# Patient Record
Sex: Female | Born: 1960 | Race: White | Hispanic: No | Marital: Single | Smoking: Never smoker
Health system: Southern US, Community
[De-identification: ages and names within clinical notes are randomized; demographics above are authoritative.]

---

## 1997-09-06 ENCOUNTER — Other Ambulatory Visit: Admission: RE | Admit: 1997-09-06 | Discharge: 1997-09-06 | Payer: Self-pay | Admitting: Obstetrics and Gynecology

## 1999-04-22 ENCOUNTER — Other Ambulatory Visit: Admission: RE | Admit: 1999-04-22 | Discharge: 1999-04-22 | Payer: Self-pay | Admitting: *Deleted

## 2000-03-07 ENCOUNTER — Other Ambulatory Visit: Admission: RE | Admit: 2000-03-07 | Discharge: 2000-03-07 | Payer: Self-pay | Admitting: Obstetrics & Gynecology

## 2000-08-12 ENCOUNTER — Inpatient Hospital Stay (HOSPITAL_COMMUNITY): Admission: AD | Admit: 2000-08-12 | Discharge: 2000-08-15 | Payer: Self-pay | Admitting: *Deleted

## 2000-08-16 ENCOUNTER — Encounter: Admission: RE | Admit: 2000-08-16 | Discharge: 2000-09-14 | Payer: Self-pay | Admitting: Obstetrics & Gynecology

## 2002-12-14 ENCOUNTER — Ambulatory Visit (HOSPITAL_COMMUNITY): Admission: RE | Admit: 2002-12-14 | Discharge: 2002-12-14 | Payer: Self-pay | Admitting: Obstetrics and Gynecology

## 2004-09-08 ENCOUNTER — Other Ambulatory Visit: Admission: RE | Admit: 2004-09-08 | Discharge: 2004-09-08 | Payer: Self-pay | Admitting: Family Medicine

## 2005-03-05 ENCOUNTER — Encounter: Admission: RE | Admit: 2005-03-05 | Discharge: 2005-03-05 | Payer: Self-pay | Admitting: Occupational Medicine

## 2011-04-26 ENCOUNTER — Other Ambulatory Visit: Payer: Self-pay | Admitting: Family Medicine

## 2011-04-26 ENCOUNTER — Other Ambulatory Visit (HOSPITAL_COMMUNITY)
Admission: RE | Admit: 2011-04-26 | Discharge: 2011-04-26 | Disposition: A | Discharge status: Home / Self Care | Payer: BC Managed Care – PPO | Source: Ambulatory Visit | Attending: Family Medicine | Admitting: Family Medicine

## 2011-04-26 DIAGNOSIS — Z1159 Encounter for screening for other viral diseases: Secondary | ICD-10-CM | POA: Insufficient documentation

## 2011-04-26 DIAGNOSIS — Z124 Encounter for screening for malignant neoplasm of cervix: Secondary | ICD-10-CM | POA: Insufficient documentation

## 2012-12-04 ENCOUNTER — Other Ambulatory Visit: Payer: Self-pay

## 2012-12-04 ENCOUNTER — Ambulatory Visit
Admission: RE | Admit: 2012-12-04 | Discharge: 2012-12-04 | Disposition: A | Discharge status: Home / Self Care | Payer: 59 | Source: Ambulatory Visit

## 2012-12-04 DIAGNOSIS — R0781 Pleurodynia: Secondary | ICD-10-CM

## 2012-12-14 ENCOUNTER — Other Ambulatory Visit: Payer: Self-pay | Admitting: *Deleted

## 2012-12-14 DIAGNOSIS — R079 Chest pain, unspecified: Secondary | ICD-10-CM

## 2012-12-14 DIAGNOSIS — R011 Cardiac murmur, unspecified: Secondary | ICD-10-CM

## 2012-12-18 ENCOUNTER — Telehealth (HOSPITAL_COMMUNITY): Payer: Self-pay | Admitting: Cardiovascular Disease

## 2012-12-26 ENCOUNTER — Telehealth (HOSPITAL_COMMUNITY): Payer: Self-pay | Admitting: Cardiovascular Disease

## 2012-12-26 NOTE — Telephone Encounter (Signed)
Pt called back regarding the message that was left for her to schedule the echo and stress tests that was ordered by Dr. Alanda Amass. She states that she is unsure why she needs the tests and wants to speak with the nurse.Marland KitchenPlease call [phone].

## 2012-12-26 NOTE — Telephone Encounter (Signed)
Left message for patient to call back regarding scheduling testing

## 2012-12-27 ENCOUNTER — Telehealth (HOSPITAL_COMMUNITY): Payer: Self-pay | Admitting: Cardiovascular Disease

## 2012-12-27 NOTE — Telephone Encounter (Signed)
Pt has orders in the computer to have a echo and stress test. Pt states that she was not told that she needed these tests and would like to know when this was decided and why. Please call pt and advise me on what to do.

## 2012-12-27 NOTE — Telephone Encounter (Signed)
;  eft message for pt. To call me back tomarrow so i can talk to her about her test and the reason they are being ordered

## 2013-01-03 ENCOUNTER — Other Ambulatory Visit: Payer: Self-pay | Admitting: *Deleted

## 2013-01-07 NOTE — Telephone Encounter (Signed)
PT. CALLED AND INFORMED THAT I WILL BE IN THE [place] OFFICE TOMARROW AND CAN CALL ME THERE OR SHE CAN CALL ME ON Tuesday AND I CAN TALK TO HER THEN AND ANSWER HER QUESTIONS

## 2014-03-18 ENCOUNTER — Other Ambulatory Visit (HOSPITAL_COMMUNITY)
Admission: RE | Admit: 2014-03-18 | Discharge: 2014-03-18 | Disposition: A | Discharge status: Home / Self Care | Payer: BC Managed Care – PPO | Source: Ambulatory Visit | Attending: Family Medicine | Admitting: Family Medicine

## 2014-03-18 ENCOUNTER — Other Ambulatory Visit: Payer: Self-pay | Admitting: Family Medicine

## 2014-03-18 DIAGNOSIS — Z124 Encounter for screening for malignant neoplasm of cervix: Secondary | ICD-10-CM | POA: Diagnosis present

## 2014-03-21 LAB — CYTOLOGY - PAP

## 2018-04-15 ENCOUNTER — Emergency Department (HOSPITAL_BASED_OUTPATIENT_CLINIC_OR_DEPARTMENT_OTHER): Payer: Self-pay

## 2018-04-15 ENCOUNTER — Other Ambulatory Visit: Payer: Self-pay

## 2018-04-15 ENCOUNTER — Encounter (HOSPITAL_BASED_OUTPATIENT_CLINIC_OR_DEPARTMENT_OTHER): Payer: Self-pay | Admitting: Emergency Medicine

## 2018-04-15 ENCOUNTER — Inpatient Hospital Stay (HOSPITAL_BASED_OUTPATIENT_CLINIC_OR_DEPARTMENT_OTHER)
Admission: EM | Admit: 2018-04-15 | Discharge: 2018-04-18 | DRG: 339 | Disposition: A | Discharge status: Home / Self Care | Payer: Self-pay | Attending: General Surgery | Admitting: General Surgery

## 2018-04-15 DIAGNOSIS — E871 Hypo-osmolality and hyponatremia: Secondary | ICD-10-CM

## 2018-04-15 DIAGNOSIS — K3532 Acute appendicitis with perforation and localized peritonitis, without abscess: Principal | ICD-10-CM | POA: Diagnosis present

## 2018-04-15 DIAGNOSIS — Z882 Allergy status to sulfonamides status: Secondary | ICD-10-CM

## 2018-04-15 DIAGNOSIS — R188 Other ascites: Secondary | ICD-10-CM | POA: Diagnosis present

## 2018-04-15 DIAGNOSIS — K3580 Unspecified acute appendicitis: Secondary | ICD-10-CM | POA: Diagnosis present

## 2018-04-15 LAB — COMPREHENSIVE METABOLIC PANEL
ALT: 22 U/L (ref 0–44)
AST: 22 U/L (ref 15–41)
Albumin: 4.1 g/dL (ref 3.5–5.0)
Alkaline Phosphatase: 59 U/L (ref 38–126)
Anion gap: 9 (ref 5–15)
BUN: 11 mg/dL (ref 6–20)
CO2: 22 mmol/L (ref 22–32)
Calcium: 9 mg/dL (ref 8.9–10.3)
Chloride: 97 mmol/L — ABNORMAL LOW (ref 98–111)
Creatinine, Ser: 0.57 mg/dL (ref 0.44–1.00)
GFR calc Af Amer: >60 mL/min (ref >60)
GFR calc non Af Amer: >60 mL/min (ref >60)
Glucose, Bld: 127 mg/dL — ABNORMAL HIGH (ref 70–99)
Potassium: 3.5 mmol/L (ref 3.5–5.1)
Sodium: 128 mmol/L — ABNORMAL LOW (ref 135–145)
Total Bilirubin: 0.8 mg/dL (ref 0.3–1.2)
Total Protein: 7.4 g/dL (ref 6.5–8.1)

## 2018-04-15 LAB — CBC
HCT: 41.8 % (ref 36.0–46.0)
Hemoglobin: 13.6 g/dL (ref 12.0–15.0)
MCH: 29.5 pg (ref 26.0–34.0)
MCHC: 32.5 g/dL (ref 30.0–36.0)
MCV: 90.7 fL (ref 80.0–100.0)
Platelets: 259 10*3/uL (ref 150–400)
RBC: 4.61 MIL/uL (ref 3.87–5.11)
RDW: 12.8 % (ref 11.5–15.5)
WBC: 18.7 10*3/uL — ABNORMAL HIGH (ref 4.0–10.5)
nRBC: 0 % (ref 0.0–0.2)

## 2018-04-15 LAB — LIPASE, BLOOD: Lipase: 25 U/L (ref 11–51)

## 2018-04-15 MED ORDER — MORPHINE SULFATE (PF) 2 MG/ML IV SOLN
2.0000 mg | Freq: Once | INTRAVENOUS | Status: AC
  Administered 2018-04-15: 2 mg via INTRAVENOUS
  Filled 2018-04-15: qty 1

## 2018-04-15 MED ORDER — SODIUM CHLORIDE 0.9 % IV SOLN
INTRAVENOUS | Status: DC | PRN
  Administered 2018-04-15: 500 mL via INTRAVENOUS

## 2018-04-15 MED ORDER — HYDROMORPHONE HCL 1 MG/ML IJ SOLN
1.0000 mg | Freq: Once | INTRAMUSCULAR | Status: AC
  Administered 2018-04-15: 1 mg via INTRAVENOUS
  Filled 2018-04-15: qty 1

## 2018-04-15 MED ORDER — SODIUM CHLORIDE 0.9 % IV BOLUS
1000.0000 mL | Freq: Once | INTRAVENOUS | Status: AC
  Administered 2018-04-15: 1000 mL via INTRAVENOUS

## 2018-04-15 MED ORDER — IOPAMIDOL (ISOVUE-300) INJECTION 61%
100.0000 mL | Freq: Once | INTRAVENOUS | Status: AC | PRN
  Administered 2018-04-15: 100 mL via INTRAVENOUS

## 2018-04-15 MED ORDER — METRONIDAZOLE IN NACL 5-0.79 MG/ML-% IV SOLN
500.0000 mg | Freq: Once | INTRAVENOUS | Status: AC
  Administered 2018-04-15: 500 mg via INTRAVENOUS
  Filled 2018-04-15: qty 100

## 2018-04-15 MED ORDER — CEFTRIAXONE SODIUM 2 G IJ SOLR
2.0000 g | Freq: Once | INTRAMUSCULAR | Status: AC
  Administered 2018-04-15: 2 g via INTRAVENOUS
  Filled 2018-04-15 (×2): qty 20

## 2018-04-15 NOTE — ED Provider Notes (Signed)
[facility] EMERGENCY DEPARTMENT Provider Note   CSN: [id number] Arrival date & time: 04/15/18  1603     History   Chief Complaint Chief Complaint  Patient presents with  . Constipation    HPI Holly Morton is a 57 y.o. female without significant past medical history, presenting to the emergency department with a acute onset of diffuse abdominal pain that began yesterday.  Pain is sharp and constant.  She states for several days she is been having decreased bowel movements.  Last night she had a loose bowel movement with some gas, however has not passed any gas today.  She is treated symptoms of constipation with laxatives and enemas without relief.  She also endorses some symptoms of indigestion.  Denies nausea or vomiting, fever, urinary symptoms, pelvic complaints.  No medications tried for pain.  LMP was years ago.  The history is provided by the patient.    History reviewed. No pertinent past medical history.  There are no active problems to display for this patient.   History reviewed. No pertinent surgical history.   OB History   None      Home Medications    Prior to Admission medications   Not on File    Family History History reviewed. No pertinent family history.  Social History Social History   Tobacco Use  . Smoking status: Never Smoker  . Smokeless tobacco: Never Used  Substance Use Topics  . Alcohol use: Never    Frequency: Never  . Drug use: Never     Allergies   Sulfa antibiotics   Review of Systems Review of Systems  Constitutional: Negative for fever.  Gastrointestinal: Positive for abdominal pain and constipation. Negative for nausea and vomiting.  Genitourinary: Negative for dysuria, flank pain, vaginal bleeding and vaginal discharge.  All other systems reviewed and are negative.    Physical Exam Updated Vital Signs BP 129/72 (BP Location: Right Arm)   Pulse 84   Temp 98.9 F (37.2 C) (Oral)   Resp 18   Ht 5'  6" (1.676 m)   Wt 77.1 kg   SpO2 98%   BMI 27.44 kg/m   Physical Exam  Constitutional: She appears well-developed and well-nourished. No distress.  HENT:  Head: Normocephalic and atraumatic.  Eyes: Conjunctivae are normal.  Cardiovascular: Normal rate and regular rhythm.  Pulmonary/Chest: Effort normal and breath sounds normal.  Abdominal: Soft. Normal appearance. She exhibits distension. There is generalized tenderness. There is rebound. There is no guarding.  There is diffuse tenderness to the abdomen with positive Rovsing sign.  Neurological: She is alert.  Skin: Skin is warm.  Psychiatric: She has a normal mood and affect. Her behavior is normal.  Nursing note and vitals reviewed.    ED Treatments / Results  Labs (all labs ordered are listed, but only abnormal results are displayed) Labs Reviewed  COMPREHENSIVE METABOLIC PANEL - Abnormal; Notable for the following components:      Result Value   Sodium 128 (*)    Chloride 97 (*)    Glucose, Bld 127 (*)    All other components within normal limits  CBC - Abnormal; Notable for the following components:   WBC 18.7 (*)    All other components within normal limits  LIPASE, BLOOD    EKG None  Radiology Ct Abdomen Pelvis W Contrast  Result Date: 04/15/2018 CLINICAL DATA:  Lower abdominal pain 1 day with diarrhea. EXAM: CT ABDOMEN AND PELVIS WITH CONTRAST TECHNIQUE: Multidetector CT imaging of  the abdomen and pelvis was performed using the standard protocol following bolus administration of intravenous contrast. CONTRAST:  ISOVUE-300 IOPAMIDOL (ISOVUE-300) INJECTION 61% COMPARISON:  None. FINDINGS: Lower chest: Lung bases are within normal. Hepatobiliary: Gallbladder and biliary tree are normal. There are several small liver cysts present. 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 Pancreas is otherwise unremarkable. Spleen: Normal. Adrenals/Urinary Tract: Adrenal glands are normal. Kidneys  are normal in size. 3 mm stone over the upper pole left kidney. No hydronephrosis. Few small left-sided subcentimeter renal cortical hypodensities too small to characterize but likely cysts. Ureters and bladder are normal. Stomach/Bowel: Stomach and small bowel are normal. Inflamed appendix measuring 1.1 cm in diameter with mild adjacent inflammatory change and free fluid. 3 mm appendicular with at the origin of the appendix. No evidence of periappendiceal abscess or perforation. Appendix: Location: Right lower quadrant. Diameter: 1.1 cm. Appendicolith: 3.2 mm. Mucosal hyper-enhancement: Minimal. Extraluminal gas: None. Periappendiceal collection: None. Mild colonic diverticulosis. Vascular/Lymphatic: Normal. Reproductive: Normal. Other: No evidence of abdominal wall hernia. Musculoskeletal: Minimal degenerate change of the spine. Subtle grade 1 anterolisthesis of L4 on L5. IMPRESSION: Evidence of acute appendicitis. No evidence of perforation or periappendiceal abscess. Several small liver cysts. Nonobstructing 3 mm left renal stone. Several small subcentimeter left renal cortical hypodensities too small to characterize but likely cysts. Mild colonic diverticulosis. These results were called by telephone at the time of interpretation on 04/15/2018 at 8:35 pm to Dr. [place] ROBINSON , who verbally acknowledged these results. Electronically Signed   By: Elberta Fortis M.D.   On: 04/15/2018 20:35    Procedures Procedures (including critical care time)  Medications Ordered in ED Medications  cefTRIAXone (ROCEPHIN) 2 g in sodium chloride 0.9 % 100 mL IVPB (has no administration in time range)    And  metroNIDAZOLE (FLAGYL) IVPB 500 mg (has no administration in time range)  0.9 %  sodium chloride infusion (has no administration in time range)  sodium chloride 0.9 % bolus 1,000 mL (1,000 mLs Intravenous New Bag/Given 04/15/18 1954)  morphine 2 MG/ML injection 2 mg (2 mg Intravenous Given 04/15/18 1954)    iopamidol (ISOVUE-300) 61 % injection 100 mL (100 mLs Intravenous Contrast Given 04/15/18 2002)  HYDROmorphone (DILAUDID) injection 1 mg (1 mg Intravenous Given 04/15/18 2054)     Initial Impression / Assessment and Plan / ED Course  I have reviewed the triage vital signs and the nursing notes.  Pertinent labs & imaging results that were available during my care of the patient were reviewed by me and considered in my medical decision making (see chart for details).  Clinical Course as of Apr 15 2058  Sat Apr 15, 2018  2051 Patient discussed with Dr. Maisie Fus was with general surgery.  Patient to be transferred to [facility] long for management of acute appendicitis.   [JR]    Clinical Course User Index [JR] Robinson, [place] N, PA-C    Patient presenting with generalized abdominal pain and decreased bowel movements.  On exam vital signs are stable, afebrile.  Abdomen is soft with diffuse tenderness and positive Rovsing sign.  Labs obtained in triage show significant leukocytosis of 18.7.  Hyponatremia 128.  Otherwise labs are reassuring.  CT scan ordered with concern for acute appendicitis versus bowel obstruction.  Pain treated.  IV fluids.  CT scan acute for acute uncomplicated appendicitis.  Discussed these results with patient as well as incidental findings on CT scan.  Consult placed to general surgery, spoke with  Dr. Maisie Fushomas.  [facility].  IV antibiotics initiated.  Agreeable to plan and stable for transfer.  Final Clinical Impressions(s) / ED Diagnoses   Final diagnoses:  Acute appendicitis, unspecified acute appendicitis type  Hyponatremia    ED Discharge Orders    None       Robinson, [place] N, PA-C 04/15/18 2059    Rolan BuccoBelfi, Melanie, MD 04/15/18 2259

## 2018-04-15 NOTE — ED Triage Notes (Signed)
Patient reports that she has had norml BM several days ago and then no BM since. She had a very small one last night  - the patient reports that this happened last week as well

## 2018-04-15 NOTE — ED Notes (Signed)
Patient transported to CT 

## 2018-04-15 NOTE — ED Notes (Signed)
Called Dr. Thomas

## 2018-04-15 NOTE — Anesthesia Preprocedure Evaluation (Addendum)
Anesthesia Evaluation  Patient identified by MRN, date of birth, ID band Patient awake    Reviewed: Allergy & Precautions, H&P , NPO status , Patient's Chart, lab work & pertinent test results, reviewed documented beta blocker date and time   Airway Mallampati: I  TM Distance: <3 FB Neck ROM: full    Dental no notable dental hx. (+) Teeth Intact, Chipped,    Pulmonary neg pulmonary ROS,    Pulmonary exam normal breath sounds clear to auscultation       Cardiovascular Exercise Tolerance: Good negative cardio ROS   Rhythm:regular Rate:Normal     Neuro/Psych negative neurological ROS  negative psych ROS   GI/Hepatic negative GI ROS, Neg liver ROS,   Endo/Other  negative endocrine ROS  Renal/GU negative Renal ROS  negative genitourinary   Musculoskeletal negative musculoskeletal ROS (+)   Abdominal   Peds  Hematology negative hematology ROS (+)   Anesthesia Other Findings   Reproductive/Obstetrics negative OB ROS                            Anesthesia Physical Anesthesia Plan  ASA: I  Anesthesia Plan: General   Post-op Pain Management:    Induction: Intravenous  PONV Risk Score and Plan: 3 and Ondansetron, Treatment may vary due to age or medical condition and Midazolam  Airway Management Planned: Oral ETT  Additional Equipment:   Intra-op Plan:   Post-operative Plan: Extubation in OR  Informed Consent: I have reviewed the patients History and Physical, chart, labs and discussed the procedure including the risks, benefits and alternatives for the proposed anesthesia with the patient or authorized representative who has indicated his/her understanding and acceptance.   Dental Advisory Given  Plan Discussed with: CRNA, Surgeon and Anesthesiologist  Anesthesia Plan Comments: (  )       Anesthesia Quick Evaluation

## 2018-04-16 ENCOUNTER — Observation Stay (HOSPITAL_COMMUNITY): Payer: Self-pay | Admitting: Anesthesiology

## 2018-04-16 ENCOUNTER — Encounter (HOSPITAL_COMMUNITY): Admission: EM | Disposition: A | Discharge status: Home / Self Care | Payer: Self-pay | Source: Home / Self Care

## 2018-04-16 ENCOUNTER — Encounter (HOSPITAL_COMMUNITY): Payer: Self-pay | Admitting: *Deleted

## 2018-04-16 HISTORY — PX: LAPAROSCOPIC APPENDECTOMY: SHX408

## 2018-04-16 LAB — BASIC METABOLIC PANEL
Anion gap: 9 (ref 5–15)
BUN: 10 mg/dL (ref 6–20)
CO2: 24 mmol/L (ref 22–32)
Calcium: 8.5 mg/dL — ABNORMAL LOW (ref 8.9–10.3)
Chloride: 101 mmol/L (ref 98–111)
Creatinine, Ser: 0.68 mg/dL (ref 0.44–1.00)
GFR calc Af Amer: >60 mL/min (ref >60)
GFR calc non Af Amer: >60 mL/min (ref >60)
Glucose, Bld: 149 mg/dL — ABNORMAL HIGH (ref 70–99)
Potassium: 3.7 mmol/L (ref 3.5–5.1)
Sodium: 134 mmol/L — ABNORMAL LOW (ref 135–145)

## 2018-04-16 LAB — CBC
HCT: 38.9 % (ref 36.0–46.0)
Hemoglobin: 12.5 g/dL (ref 12.0–15.0)
MCH: 29.8 pg (ref 26.0–34.0)
MCHC: 32.1 g/dL (ref 30.0–36.0)
MCV: 92.8 fL (ref 80.0–100.0)
Platelets: 236 10*3/uL (ref 150–400)
RBC: 4.19 MIL/uL (ref 3.87–5.11)
RDW: 13 % (ref 11.5–15.5)
WBC: 15.2 10*3/uL — AB (ref 4.0–10.5)
nRBC: 0 % (ref 0.0–0.2)

## 2018-04-16 LAB — HIV ANTIBODY (ROUTINE TESTING W REFLEX): HIV Screen 4th Generation wRfx: NONREACTIVE

## 2018-04-16 LAB — SURGICAL PCR SCREEN
MRSA, PCR: NEGATIVE
STAPHYLOCOCCUS AUREUS: NEGATIVE

## 2018-04-16 SURGERY — APPENDECTOMY, LAPAROSCOPIC
Anesthesia: General | Site: Abdomen

## 2018-04-16 MED ORDER — DEXAMETHASONE SODIUM PHOSPHATE 10 MG/ML IJ SOLN
INTRAMUSCULAR | Status: AC
  Filled 2018-04-16: qty 1

## 2018-04-16 MED ORDER — PROPOFOL 10 MG/ML IV BOLUS
INTRAVENOUS | Status: AC
  Filled 2018-04-16: qty 40

## 2018-04-16 MED ORDER — FENTANYL CITRATE (PF) 250 MCG/5ML IJ SOLN
INTRAMUSCULAR | Status: AC
  Filled 2018-04-16: qty 5

## 2018-04-16 MED ORDER — OXYCODONE HCL 5 MG PO TABS: 5.0000 mg | ORAL_TABLET | Freq: Once | ORAL | Status: DC | PRN

## 2018-04-16 MED ORDER — DEXAMETHASONE SODIUM PHOSPHATE 10 MG/ML IJ SOLN
INTRAMUSCULAR | Status: DC | PRN
  Administered 2018-04-16: 6 mg via INTRAVENOUS

## 2018-04-16 MED ORDER — MORPHINE SULFATE (PF) 2 MG/ML IV SOLN
2.0000 mg | INTRAVENOUS | Status: DC | PRN
  Administered 2018-04-16 (×2): 2 mg via INTRAVENOUS
  Filled 2018-04-16 (×2): qty 1

## 2018-04-16 MED ORDER — MIDAZOLAM HCL 2 MG/2ML IJ SOLN
INTRAMUSCULAR | Status: AC
  Filled 2018-04-16: qty 2

## 2018-04-16 MED ORDER — ACETAMINOPHEN 325 MG PO TABS
650.0000 mg | ORAL_TABLET | Freq: Once | ORAL | Status: AC
  Administered 2018-04-16: 650 mg via ORAL
  Filled 2018-04-16: qty 2

## 2018-04-16 MED ORDER — SUGAMMADEX SODIUM 200 MG/2ML IV SOLN
INTRAVENOUS | Status: AC
  Filled 2018-04-16: qty 2

## 2018-04-16 MED ORDER — ACETAMINOPHEN 325 MG PO TABS: 325.0000 mg | ORAL_TABLET | ORAL | Status: DC | PRN

## 2018-04-16 MED ORDER — HYDROMORPHONE HCL 1 MG/ML IJ SOLN
INTRAMUSCULAR | Status: DC | PRN
  Administered 2018-04-16 (×2): 1 mg via INTRAVENOUS

## 2018-04-16 MED ORDER — ACETAMINOPHEN 160 MG/5ML PO SOLN: 325.0000 mg | ORAL | Status: DC | PRN

## 2018-04-16 MED ORDER — MORPHINE SULFATE (PF) 2 MG/ML IV SOLN: 2.0000 mg | INTRAVENOUS | Status: DC | PRN

## 2018-04-16 MED ORDER — ONDANSETRON 4 MG PO TBDP: 4.0000 mg | ORAL_TABLET | Freq: Four times a day (QID) | ORAL | Status: DC | PRN

## 2018-04-16 MED ORDER — LIDOCAINE 2% (20 MG/ML) 5 ML SYRINGE
INTRAMUSCULAR | Status: DC | PRN
  Administered 2018-04-16: 100 mg via INTRAVENOUS

## 2018-04-16 MED ORDER — ONDANSETRON HCL 4 MG/2ML IJ SOLN: 4.0000 mg | Freq: Once | INTRAMUSCULAR | Status: DC | PRN

## 2018-04-16 MED ORDER — SUGAMMADEX SODIUM 200 MG/2ML IV SOLN
INTRAVENOUS | Status: DC | PRN
  Administered 2018-04-16: 150 mg via INTRAVENOUS

## 2018-04-16 MED ORDER — INFLUENZA VAC SPLIT QUAD 0.5 ML IM SUSY: 0.5000 mL | PREFILLED_SYRINGE | INTRAMUSCULAR | Status: DC

## 2018-04-16 MED ORDER — SODIUM CHLORIDE 0.9 % IV SOLN
2.0000 g | INTRAVENOUS | Status: DC
  Administered 2018-04-16 – 2018-04-17 (×2): 2 g via INTRAVENOUS
  Filled 2018-04-16 (×2): qty 2
  Filled 2018-04-16: qty 20

## 2018-04-16 MED ORDER — METRONIDAZOLE IN NACL 5-0.79 MG/ML-% IV SOLN
500.0000 mg | Freq: Three times a day (TID) | INTRAVENOUS | Status: DC
  Administered 2018-04-16 – 2018-04-18 (×7): 500 mg via INTRAVENOUS
  Filled 2018-04-16 (×7): qty 100

## 2018-04-16 MED ORDER — LIDOCAINE 2% (20 MG/ML) 5 ML SYRINGE
INTRAMUSCULAR | Status: AC
  Filled 2018-04-16: qty 10

## 2018-04-16 MED ORDER — ROCURONIUM BROMIDE 100 MG/10ML IV SOLN
INTRAVENOUS | Status: AC
  Filled 2018-04-16: qty 1

## 2018-04-16 MED ORDER — PROPOFOL 10 MG/ML IV BOLUS
INTRAVENOUS | Status: DC | PRN
  Administered 2018-04-16: 160 mg via INTRAVENOUS

## 2018-04-16 MED ORDER — MEPERIDINE HCL 50 MG/ML IJ SOLN: 6.2500 mg | INTRAMUSCULAR | Status: DC | PRN

## 2018-04-16 MED ORDER — ENOXAPARIN SODIUM 30 MG/0.3ML ~~LOC~~ SOLN
30.0000 mg | Freq: Every day | SUBCUTANEOUS | Status: DC
  Administered 2018-04-17 – 2018-04-18 (×2): 30 mg via SUBCUTANEOUS
  Filled 2018-04-16 (×2): qty 0.3

## 2018-04-16 MED ORDER — ROCURONIUM BROMIDE 10 MG/ML (PF) SYRINGE
PREFILLED_SYRINGE | INTRAVENOUS | Status: DC | PRN
  Administered 2018-04-16: 50 mg via INTRAVENOUS
  Administered 2018-04-16: 10 mg via INTRAVENOUS

## 2018-04-16 MED ORDER — ONDANSETRON HCL 4 MG/2ML IJ SOLN: 4.0000 mg | Freq: Four times a day (QID) | INTRAMUSCULAR | Status: DC | PRN

## 2018-04-16 MED ORDER — SIMETHICONE 80 MG PO CHEW: 40.0000 mg | CHEWABLE_TABLET | Freq: Four times a day (QID) | ORAL | Status: DC | PRN

## 2018-04-16 MED ORDER — DIPHENHYDRAMINE HCL 12.5 MG/5ML PO ELIX: 12.5000 mg | ORAL_SOLUTION | Freq: Four times a day (QID) | ORAL | Status: DC | PRN

## 2018-04-16 MED ORDER — BISACODYL 10 MG RE SUPP: 10.0000 mg | Freq: Every day | RECTAL | Status: DC | PRN

## 2018-04-16 MED ORDER — MIDAZOLAM HCL 5 MG/5ML IJ SOLN
INTRAMUSCULAR | Status: DC | PRN
  Administered 2018-04-16: 1 mg via INTRAVENOUS

## 2018-04-16 MED ORDER — HYDROCODONE-ACETAMINOPHEN 5-325 MG PO TABS
1.0000 | ORAL_TABLET | ORAL | Status: DC | PRN
  Administered 2018-04-16 – 2018-04-17 (×2): 1 via ORAL
  Filled 2018-04-16 (×2): qty 1

## 2018-04-16 MED ORDER — HYDROMORPHONE HCL 2 MG/ML IJ SOLN
INTRAMUSCULAR | Status: AC
  Filled 2018-04-16: qty 1

## 2018-04-16 MED ORDER — SENNOSIDES-DOCUSATE SODIUM 8.6-50 MG PO TABS
1.0000 | ORAL_TABLET | Freq: Every day | ORAL | Status: DC
  Administered 2018-04-16 – 2018-04-17 (×2): 1 via ORAL
  Filled 2018-04-16 (×2): qty 1

## 2018-04-16 MED ORDER — OXYCODONE HCL 5 MG/5ML PO SOLN
5.0000 mg | Freq: Once | ORAL | Status: DC | PRN
  Filled 2018-04-16: qty 5

## 2018-04-16 MED ORDER — LACTATED RINGERS IV SOLN
INTRAVENOUS | Status: DC | PRN
  Administered 2018-04-16 (×2): via INTRAVENOUS

## 2018-04-16 MED ORDER — BUPIVACAINE-EPINEPHRINE (PF) 0.25% -1:200000 IJ SOLN
INTRAMUSCULAR | Status: AC
  Filled 2018-04-16: qty 30

## 2018-04-16 MED ORDER — FENTANYL CITRATE (PF) 100 MCG/2ML IJ SOLN: 25.0000 ug | INTRAMUSCULAR | Status: DC | PRN

## 2018-04-16 MED ORDER — ONDANSETRON HCL 4 MG/2ML IJ SOLN
INTRAMUSCULAR | Status: AC
  Filled 2018-04-16: qty 2

## 2018-04-16 MED ORDER — BUPIVACAINE HCL (PF) 0.5 % IJ SOLN
INTRAMUSCULAR | Status: AC
  Filled 2018-04-16: qty 30

## 2018-04-16 MED ORDER — ENOXAPARIN SODIUM 40 MG/0.4ML ~~LOC~~ SOLN
40.0000 mg | SUBCUTANEOUS | Status: DC
  Administered 2018-04-16: 40 mg via SUBCUTANEOUS
  Filled 2018-04-16: qty 0.4

## 2018-04-16 MED ORDER — KCL IN DEXTROSE-NACL 20-5-0.45 MEQ/L-%-% IV SOLN
INTRAVENOUS | Status: DC
  Administered 2018-04-16 – 2018-04-17 (×3): via INTRAVENOUS
  Filled 2018-04-16 (×3): qty 1000

## 2018-04-16 MED ORDER — FENTANYL CITRATE (PF) 100 MCG/2ML IJ SOLN
INTRAMUSCULAR | Status: DC | PRN
  Administered 2018-04-16: 100 ug via INTRAVENOUS
  Administered 2018-04-16 (×3): 50 ug via INTRAVENOUS

## 2018-04-16 MED ORDER — DIPHENHYDRAMINE HCL 50 MG/ML IJ SOLN: 12.5000 mg | Freq: Four times a day (QID) | INTRAMUSCULAR | Status: DC | PRN

## 2018-04-16 MED ORDER — LACTATED RINGERS IR SOLN
Status: DC | PRN
  Administered 2018-04-16: 3000 mL

## 2018-04-16 MED ORDER — 0.9 % SODIUM CHLORIDE (POUR BTL) OPTIME
TOPICAL | Status: DC | PRN
  Administered 2018-04-16: 1000 mL

## 2018-04-16 MED ORDER — BUPIVACAINE-EPINEPHRINE (PF) 0.25% -1:200000 IJ SOLN
INTRAMUSCULAR | Status: DC | PRN
  Administered 2018-04-16: 20 mL

## 2018-04-16 SURGICAL SUPPLY — 53 items
APPLIER CLIP 5 13 M/L LIGAMAX5 (MISCELLANEOUS)
APPLIER CLIP ROT 10 11.4 M/L (STAPLE)
CABLE HIGH FREQUENCY MONO STRZ (ELECTRODE) ×3 IMPLANT
CHLORAPREP W/TINT 26ML (MISCELLANEOUS) ×3 IMPLANT
CLIP APPLIE 5 13 M/L LIGAMAX5 (MISCELLANEOUS) IMPLANT
CLIP APPLIE ROT 10 11.4 M/L (STAPLE) IMPLANT
COVER WAND RF STERILE (DRAPES) IMPLANT
DECANTER SPIKE VIAL GLASS SM (MISCELLANEOUS) ×3 IMPLANT
DERMABOND ADVANCED (GAUZE/BANDAGES/DRESSINGS) ×2
DERMABOND ADVANCED .7 DNX12 (GAUZE/BANDAGES/DRESSINGS) ×1 IMPLANT
DRAIN CHANNEL 19F RND (DRAIN) ×3 IMPLANT
DRAPE LAPAROSCOPIC ABDOMINAL (DRAPES) IMPLANT
DRSG TEGADERM 4X4.75 (GAUZE/BANDAGES/DRESSINGS) ×3 IMPLANT
ELECT REM PT RETURN 15FT ADLT (MISCELLANEOUS) ×3 IMPLANT
EVACUATOR SILICONE 100CC (DRAIN) ×3 IMPLANT
GLOVE BIO SURGEON STRL SZ 6.5 (GLOVE) ×2 IMPLANT
GLOVE BIO SURGEONS STRL SZ 6.5 (GLOVE) ×1
GLOVE BIOGEL PI IND STRL 6.5 (GLOVE) ×1 IMPLANT
GLOVE BIOGEL PI IND STRL 7.0 (GLOVE) ×1 IMPLANT
GLOVE BIOGEL PI IND STRL 7.5 (GLOVE) ×3 IMPLANT
GLOVE BIOGEL PI INDICATOR 6.5 (GLOVE) ×2
GLOVE BIOGEL PI INDICATOR 7.0 (GLOVE) ×2
GLOVE BIOGEL PI INDICATOR 7.5 (GLOVE) ×6
GLOVE SURG SS PI 6.5 STRL IVOR (GLOVE) ×3 IMPLANT
GLOVE SURG SS PI 7.5 STRL IVOR (GLOVE) ×3 IMPLANT
GOWN SPEC L3 XXLG W/TWL (GOWN DISPOSABLE) ×3 IMPLANT
GOWN STRL REUS W/TWL 2XL LVL3 (GOWN DISPOSABLE) ×3 IMPLANT
GOWN STRL REUS W/TWL LRG LVL3 (GOWN DISPOSABLE) ×3 IMPLANT
GOWN STRL REUS W/TWL XL LVL3 (GOWN DISPOSABLE) ×3 IMPLANT
GRASPER SUT TROCAR 14GX15 (MISCELLANEOUS) IMPLANT
HANDLE STAPLE EGIA 4 XL (STAPLE) ×3 IMPLANT
IRRIG SUCT STRYKERFLOW 2 WTIP (MISCELLANEOUS) ×3
IRRIGATION SUCT STRKRFLW 2 WTP (MISCELLANEOUS) ×1 IMPLANT
KIT BASIN OR (CUSTOM PROCEDURE TRAY) ×3 IMPLANT
MARKER SKIN DUAL TIP RULER LAB (MISCELLANEOUS) IMPLANT
POUCH SPECIMEN RETRIEVAL 10MM (ENDOMECHANICALS) ×3 IMPLANT
RELOAD EGIA 45 MED/THCK PURPLE (STAPLE) IMPLANT
RELOAD EGIA 45 TAN VASC (STAPLE) IMPLANT
RELOAD EGIA 60 MED/THCK PURPLE (STAPLE) ×3 IMPLANT
RELOAD EGIA 60 TAN VASC (STAPLE) ×3 IMPLANT
SCISSORS LAP 5X35 DISP (ENDOMECHANICALS) IMPLANT
SLEEVE XCEL OPT CAN 5 100 (ENDOMECHANICALS) IMPLANT
SPONGE DRAIN TRACH 4X4 STRL 2S (GAUZE/BANDAGES/DRESSINGS) ×3 IMPLANT
SUT ETHILON 2 0 PS N (SUTURE) ×3 IMPLANT
SUT VIC AB 2-0 SH 27 (SUTURE)
SUT VIC AB 2-0 SH 27X BRD (SUTURE) IMPLANT
SUT VIC AB 4-0 PS2 27 (SUTURE) ×3 IMPLANT
SUT VICRYL 0 UR6 27IN ABS (SUTURE) IMPLANT
TOWEL OR 17X26 10 PK STRL BLUE (TOWEL DISPOSABLE) ×3 IMPLANT
TRAY FOLEY MTR SLVR 14FR STAT (SET/KITS/TRAYS/PACK) ×3 IMPLANT
TRAY LAPAROSCOPIC (CUSTOM PROCEDURE TRAY) ×3 IMPLANT
TROCAR BLADELESS OPT 5 100 (ENDOMECHANICALS) IMPLANT
TROCAR XCEL BLUNT TIP 100MML (ENDOMECHANICALS) ×3 IMPLANT

## 2018-04-16 NOTE — Anesthesia Postprocedure Evaluation (Signed)
Anesthesia Post Note  Patient: Clent RidgesLori Gasbarro  Procedure(s) Performed: APPENDECTOMY LAPAROSCOPIC (N/A Abdomen)     Patient location during evaluation: PACU Anesthesia Type: General Level of consciousness: awake and alert Pain management: pain level controlled Vital Signs Assessment: post-procedure vital signs reviewed and stable Respiratory status: spontaneous breathing, nonlabored ventilation, respiratory function stable and patient connected to nasal cannula oxygen Cardiovascular status: blood pressure returned to baseline and stable Postop Assessment: no apparent nausea or vomiting Anesthetic complications: no    Last Vitals:  Vitals:   04/16/18 1156 04/16/18 1302  BP: 113/63 113/69  Pulse: 74 75  Resp: 16 16  Temp: 36.7 C 36.8 C  SpO2: 93% 95%    Last Pain:  Vitals:   04/16/18 1302  TempSrc: Oral  PainSc:                  Lerin Jech

## 2018-04-16 NOTE — Anesthesia Procedure Notes (Addendum)
Procedure Name: Intubation Date/Time: 04/16/2018 7:36 AM Performed by: Illene SilverEvans, Shakinah Navis E, CRNA Pre-anesthesia Checklist: Patient identified, Emergency Drugs available, Suction available and Patient being monitored Patient Re-evaluated:Patient Re-evaluated prior to induction Oxygen Delivery Method: Circle system utilized Preoxygenation: Pre-oxygenation with 100% oxygen Induction Type: IV induction Ventilation: Mask ventilation without difficulty Laryngoscope Size: Glidescope and 4 Grade View: Grade II Tube type: Parker flex tip Tube size: 7.5 mm Number of attempts: 1 Airway Equipment and Method: Stylet and Video-laryngoscopy Placement Confirmation: ETT inserted through vocal cords under direct vision,  positive ETCO2 and breath sounds checked- equal and bilateral Secured at: 21 cm Tube secured with: Tape Dental Injury: Teeth and Oropharynx as per pre-operative assessment  Difficulty Due To: Difficulty was anticipated, Difficult Airway- due to anterior larynx, Difficult Airway- due to limited oral opening and Difficult Airway- due to dentition Comments: Pt had overbite and no chin, small oral opening. Great view with glidesope

## 2018-04-16 NOTE — Transfer of Care (Signed)
Immediate Anesthesia Transfer of Care Note  Patient: Holly RidgesLori Morton  Procedure(s) Performed: APPENDECTOMY LAPAROSCOPIC (N/A Abdomen)  Patient Location: PACU  Anesthesia Type:General  Level of Consciousness: awake, alert , oriented and patient cooperative  Airway & Oxygen Therapy: Patient Spontanous Breathing and Patient connected to face mask oxygen  Post-op Assessment: Report given to RN, Post -op Vital signs reviewed and stable and Patient moving all extremities X 4  Post vital signs: stable  Last Vitals:  Vitals Value Taken Time  BP 117/71 04/16/2018  8:45 AM  Temp 36.9 C 04/16/2018  8:45 AM  Pulse 79 04/16/2018  8:50 AM  Resp 10 04/16/2018  8:50 AM  SpO2 98 % 04/16/2018  8:50 AM  Vitals shown include unvalidated device data.  Last Pain:  Vitals:   04/16/18 0530  TempSrc:   PainSc: 3       Patients Stated Pain Goal: 2 (04/16/18 0448)  Complications: No apparent anesthesia complications

## 2018-04-16 NOTE — H&P (Signed)
Holly Morton is an 57 y.o. female.   Chief Complaint: RLQ pain HPI: pt began to have bloating and abd pain Fri afternoon.  This worsened on Sat and she presented to [facility] for eval.  CT shows appendicitis.  History reviewed. No pertinent past medical history.  History reviewed. No pertinent surgical history.  History reviewed. No pertinent family history. Social History:  reports that she has never smoked. She has never used smokeless tobacco. She reports that she does not drink alcohol or use drugs.  Allergies:  Allergies  Allergen Reactions  . Sulfa Antibiotics Hives    Medications Prior to Admission  Medication Sig Dispense Refill  . Multiple Vitamins-Minerals (MULTIVITAMIN ADULT) CHEW Chew 1 tablet by mouth daily.      Results for orders placed or performed during the hospital encounter of 04/15/18 (from the past 48 hour(s))  Lipase, blood     Status: None   Collection Time: 04/15/18  6:05 PM  Result Value Ref Range   Lipase 25 11 - 51 U/L    Comment: Performed at [facility], [address] [place], [place] [address]  Comprehensive metabolic panel     Status: Abnormal   Collection Time: 04/15/18  6:05 PM  Result Value Ref Range   Sodium 128 (L) 135 - 145 mmol/L   Potassium 3.5 3.5 - 5.1 mmol/L   Chloride 97 (L) 98 - 111 mmol/L   CO2 22 22 - 32 mmol/L   Glucose, Bld 127 (H) 70 - 99 mg/dL   BUN 11 6 - 20 mg/dL   Creatinine, Ser 1.91 0.44 - 1.00 mg/dL   Calcium 9.0 8.9 - 47.8 mg/dL   Total Protein 7.4 6.5 - 8.1 g/dL   Albumin 4.1 3.5 - 5.0 g/dL   AST 22 15 - 41 U/L   ALT 22 0 - 44 U/L   Alkaline Phosphatase 59 38 - 126 U/L   Total Bilirubin 0.8 0.3 - 1.2 mg/dL   GFR calc non Af Amer >60 >60 mL/min   GFR calc Af Amer >60 >60 mL/min   Anion gap 9 5 - 15    Comment: Performed at [facility], [address] [place], [place] [address]  CBC     Status: Abnormal   Collection Time: 04/15/18  6:05 PM  Result Value Ref Range   WBC 18.7 (H) 4.0 -  10.5 K/uL   RBC 4.61 3.87 - 5.11 MIL/uL   Hemoglobin 13.6 12.0 - 15.0 g/dL   HCT 13.0 86.5 - 78.4 %   MCV 90.7 80.0 - 100.0 fL   MCH 29.5 26.0 - 34.0 pg   MCHC 32.5 30.0 - 36.0 g/dL   RDW 69.6 29.5 - 28.4 %   Platelets 259 150 - 400 K/uL   nRBC 0.0 0.0 - 0.2 %    Comment: Performed at [facility], [address] [place], [place] [address]  Surgical PCR screen     Status: None   Collection Time: 04/16/18 12:27 AM  Result Value Ref Range   MRSA, PCR NEGATIVE NEGATIVE   Staphylococcus aureus NEGATIVE NEGATIVE    Comment: (NOTE) The Xpert SA Assay (FDA approved for NASAL specimens in patients 27 years of age and older), is one component of a comprehensive surveillance program. It is not intended to diagnose infection nor to guide or monitor treatment. Performed at [facility], [address], [place], [place] [address]   Basic metabolic panel  Status: Abnormal   Collection Time: 04/16/18  3:51 AM  Result Value Ref Range   Sodium 134 (L) 135 - 145 mmol/L   Potassium 3.7 3.5 - 5.1 mmol/L   Chloride 101 98 - 111 mmol/L   CO2 24 22 - 32 mmol/L   Glucose, Bld 149 (H) 70 - 99 mg/dL   BUN 10 6 - 20 mg/dL   Creatinine, Ser 1.61 0.44 - 1.00 mg/dL   Calcium 8.5 (L) 8.9 - 10.3 mg/dL   GFR calc non Af Amer >60 >60 mL/min   GFR calc Af Amer >60 >60 mL/min   Anion gap 9 5 - 15    Comment: Performed at [facility], [address] [place], [place] [address]  CBC     Status: Abnormal   Collection Time: 04/16/18  3:51 AM  Result Value Ref Range   WBC 15.2 (H) 4.0 - 10.5 K/uL   RBC 4.19 3.87 - 5.11 MIL/uL   Hemoglobin 12.5 12.0 - 15.0 g/dL   HCT 54.0 98.1 - 19.1 %   MCV 92.8 80.0 - 100.0 fL   MCH 29.8 26.0 - 34.0 pg   MCHC 32.1 30.0 - 36.0 g/dL   RDW 47.8 29.5 - 62.1 %   Platelets 236 150 - 400 K/uL   nRBC 0.0 0.0 - 0.2 %    Comment: Performed at [facility], [address], [place], [place] [address]   Ct  Abdomen Pelvis W Contrast  Result Date: 04/15/2018 CLINICAL DATA:  Lower abdominal pain 1 day with diarrhea. EXAM: CT ABDOMEN AND PELVIS WITH CONTRAST TECHNIQUE: Multidetector CT imaging of the abdomen and pelvis was performed using the standard protocol following bolus administration of intravenous contrast. CONTRAST:  ISOVUE-300 IOPAMIDOL (ISOVUE-300) INJECTION 61% COMPARISON:  None. FINDINGS: Lower chest: Lung bases are within normal. Hepatobiliary: Gallbladder and biliary tree are normal. There are several small liver cysts present. 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 Pancreas is otherwise unremarkable. Spleen: Normal. Adrenals/Urinary Tract: Adrenal glands are normal. Kidneys are normal in size. 3 mm stone over the upper pole left kidney. No hydronephrosis. Few small left-sided subcentimeter renal cortical hypodensities too small to characterize but likely cysts. Ureters and bladder are normal. Stomach/Bowel: Stomach and small bowel are normal. Inflamed appendix measuring 1.1 cm in diameter with mild adjacent inflammatory change and free fluid. 3 mm appendicular with at the origin of the appendix. No evidence of periappendiceal abscess or perforation. Appendix: Location: Right lower quadrant. Diameter: 1.1 cm. Appendicolith: 3.2 mm. Mucosal hyper-enhancement: Minimal. Extraluminal gas: None. Periappendiceal collection: None. Mild colonic diverticulosis. Vascular/Lymphatic: Normal. Reproductive: Normal. Other: No evidence of abdominal wall hernia. Musculoskeletal: Minimal degenerate change of the spine. Subtle grade 1 anterolisthesis of L4 on L5. IMPRESSION: Evidence of acute appendicitis. No evidence of perforation or periappendiceal abscess. Several small liver cysts. Nonobstructing 3 mm left renal stone. Several small subcentimeter left renal cortical hypodensities too small to characterize but likely cysts. Mild colonic diverticulosis. These results were called by  telephone at the time of interpretation on 04/15/2018 at 8:35 pm to Dr. [place] ROBINSON , who verbally acknowledged these results. Electronically Signed   By: Elberta Fortis M.D.   On: 04/15/2018 20:35    Review of Systems  Constitutional: Positive for fever. Negative for chills.  HENT: Negative for hearing loss.   Eyes: Negative for blurred vision.  Respiratory: Negative for cough and shortness of breath.   Cardiovascular: Negative for chest pain and  palpitations.  Gastrointestinal: Positive for abdominal pain and constipation. Negative for nausea and vomiting.  Genitourinary: Negative for dysuria, frequency and urgency.  Musculoskeletal: Negative for myalgias.  Skin: Negative for rash.    Blood pressure (!) 129/58, pulse (!) 102, temperature (!) 102.5 F (39.2 C), temperature source Oral, resp. rate 18, height 5\' 6"  (1.676 m), weight 77.1 kg, SpO2 97 %. Physical Exam  Constitutional: She is oriented to person, place, and time. She appears well-developed and well-nourished.  HENT:  Head: Normocephalic and atraumatic.  Eyes: Pupils are equal, round, and reactive to light. Conjunctivae and EOM are normal.  Neck: Normal range of motion. Neck supple.  Cardiovascular: Normal rate and regular rhythm.  Respiratory: Effort normal. No respiratory distress.  GI: Soft. There is tenderness (RLQ).  Musculoskeletal: Normal range of motion.  Neurological: She is alert and oriented to person, place, and time.  Skin: Skin is warm and dry.     Assessment/Plan 57 y.o. F with acute appendicitis.  She has received IV abx and is now ready for surgical resection.  Risks include bleeding, infection, damage to adjacent structures and hernia.  I believe she understands this and is ready to proceed.  Vanita PandaAlicia C Quill Grinder, MD 04/16/2018, 6:39 AM

## 2018-04-16 NOTE — Op Note (Signed)
Holly Morton [id number]   PRE-OPERATIVE DIAGNOSIS:  Acute Appendicitis  POST-OPERATIVE DIAGNOSIS:  Perforated Appendicitis    Procedure(s): APPENDECTOMY LAPAROSCOPIC    Surgeon(s): Romie Levee, MD  ASSISTANT: none   ANESTHESIA:   local and general  EBL:   30ml  Delay start of Pharmacological VTE agent (>24hrs) due to surgical blood loss or risk of bleeding:  no  DRAINS: (43F) Jackson-Pratt drain(s) with closed bulb suction in the RLQ   SPECIMEN:  Source of Specimen:  appendix  DISPOSITION OF SPECIMEN:  PATHOLOGY  COUNTS:  YES  PLAN OF CARE: Pt already admitted  PATIENT DISPOSITION:  PACU - hemodynamically stable.   INDICATIONS: Patient with concerning symptoms & work up suspicious for appendicitis.  Surgery was recommended:  The anatomy & physiology of the digestive tract was discussed.  The pathophysiology of appendicitis was discussed.  Natural history risks without surgery was discussed.   I feel the risks of no intervention will lead to serious problems that outweigh the operative risks; therefore, I recommended diagnostic laparoscopy with removal of appendix to remove the pathology.  Laparoscopic & open techniques were discussed.   I noted a good likelihood this will help address the problem.    Risks such as bleeding, infection, abscess, leak, reoperation, possible ostomy, hernia, heart attack, death, and other risks were discussed.  Goals of post-operative recovery were discussed as well.  We will work to minimize complications.  Questions were answered.  The patient expresses understanding & wishes to proceed with surgery.  OR FINDINGS: Perforated Appendicitis with purulent ascites of the right abdomen and pelvis  DESCRIPTION:   The patient was identified & brought into the operating room. The patient was positioned supine with left arm tucked. SCDs were active during the entire case. The patient underwent general anesthesia without any difficulty.  A foley  catheter was inserted under sterile conditions. The abdomen was prepped and draped in a sterile fashion. A Surgical Timeout confirmed our plan.   I made a transverse incision through the superior umbilical fold.  I made a nick in the infraumbilical fascia and confirmed peritoneal entry.  I placed a stay suture and then the [facility] port.  We induced carbon dioxide insufflation.  Camera inspection revealed no injury.  I placed additional ports under direct laparoscopic visualization.  I mobilized the terminal ileum to proximal ascending colon in a lateral to medial fashion.  I took care to avoid injuring any retroperitoneal structures.  There was an obvious perforation draining purulent fecal matter towards the base of the appendix.  I freed the appendix off its attachments to the ascending colon and cecal mesentery.  I elevated the appendix.  I was able to free off the base of the appendix, which was still viable.  I stapled the appendix off the cecum using a laparoscopic bowel load Covidian 60mm stapler.  I took a healthy cuff viable cecum. I skeletonized & ligated the mesoappendix with a vascular load stapler.   I placed the appendix inside an EndoCatch bag and removed out the [place] port.  I did copious irrigation. Hemostasis was good in the mesoappendix, colon mesentery, and retroperitoneum. Staple line was intact on the cecum with no bleeding. I washed out the pelvis, retrohepatic space and right paracolic gutter.  Hemostasis was good.  Due to the feculent drainage, I placed a 43F blake drain in the RLQ and brought this out through the suprapubic port site.  It was secured with a 2-0 Nylon suture.   I aspirated the  carbon dioxide. I removed the ports. I closed the umbilical fascia site using a 0 Vicryl stitch. I closed skin using 4-0 vicryl stitch.  Sterile dressings were applied.  Patient was extubated and sent to the recovery room.  I discussed the operative findings with the patient's  family. I  suspect the patient is going used in the hospital at least overnight and will need antibiotics for 7 days. Questions answered. They expressed understanding and appreciation.

## 2018-04-16 NOTE — Anesthesia Postprocedure Evaluation (Signed)
Anesthesia Post Note  Patient: Holly Morton  Procedure(s) Performed: APPENDECTOMY LAPAROSCOPIC (N/A Abdomen)     Anesthesia Type: General    Last Vitals:  Vitals:   04/16/18 1156 04/16/18 1302  BP: 113/63 113/69  Pulse: 74 75  Resp: 16 16  Temp: 36.7 C 36.8 C  SpO2: 93% 95%    Last Pain:  Vitals:   04/16/18 1302  TempSrc: Oral  PainSc:                  Sylvestre Rathgeber

## 2018-04-17 ENCOUNTER — Encounter (HOSPITAL_COMMUNITY): Payer: Self-pay | Admitting: General Surgery

## 2018-04-17 MED ORDER — CALCIUM CARBONATE ANTACID 500 MG PO CHEW
1.0000 | CHEWABLE_TABLET | Freq: Four times a day (QID) | ORAL | Status: DC | PRN
  Administered 2018-04-17: 400 mg via ORAL
  Filled 2018-04-17: qty 2

## 2018-04-17 MED ORDER — ACETAMINOPHEN 500 MG PO TABS
1000.0000 mg | ORAL_TABLET | Freq: Four times a day (QID) | ORAL | Status: DC
  Administered 2018-04-17 – 2018-04-18 (×5): 1000 mg via ORAL
  Filled 2018-04-17 (×5): qty 2

## 2018-04-17 MED ORDER — OXYCODONE HCL 5 MG PO TABS
5.0000 mg | ORAL_TABLET | ORAL | Status: DC | PRN
  Administered 2018-04-17 (×2): 5 mg via ORAL
  Filled 2018-04-17 (×2): qty 1

## 2018-04-17 NOTE — Progress Notes (Signed)
Patient ID: Holly Morton, female   DOB: 1961/04/11, 57 y.o.   MRN: [id number]    1 Day Post-Op  Subjective: Patient c/o soreness, but no other issues.  No flatus yet.  No nausea.  On some full liquids.  Objective: Vital signs in last 24 hours: Temp:  [97.4 F (36.3 C)-98.7 F (37.1 C)] 98 F (36.7 C) (12/02 0624) Pulse Rate:  [74-91] 81 (12/02 0624) Resp:  [13-20] 18 (12/02 0624) BP: (102-126)/(50-73) 120/70 (12/02 0624) SpO2:  [93 %-99 %] 97 % (12/02 0624) Last BM Date: (unsure)  Intake/Output from previous day: 12/01 0701 - 12/02 0700 In: 4098.4 [P.O.:960; I.V.:2738.4; IV Piggyback:400] Out: 2683 [Urine:2460; Drains:218; Blood:5] Intake/Output this shift: No intake/output data recorded.  PE: Heart: regular Lungs: CTAB Abd: soft, appropriately tender, hypoactive BS, mild bloating.  Incisions c/d/i.  JP drain with 218cc since placement.  serosang mostly, slight cloudiness noted.  Lab Results:  Recent Labs    04/15/18 1805 04/16/18 0351  WBC 18.7* 15.2*  HGB 13.6 12.5  HCT 41.8 38.9  PLT 259 236   BMET Recent Labs    04/15/18 1805 04/16/18 0351  NA 128* 134*  K 3.5 3.7  CL 97* 101  CO2 22 24  GLUCOSE 127* 149*  BUN 11 10  CREATININE 0.57 0.68  CALCIUM 9.0 8.5*   PT/INR No results for input(s): LABPROT, INR in the last 72 hours. CMP     Component Value Date/Time   NA 134 (L) 04/16/2018 0351   K 3.7 04/16/2018 0351   CL 101 04/16/2018 0351   CO2 24 04/16/2018 0351   GLUCOSE 149 (H) 04/16/2018 0351   BUN 10 04/16/2018 0351   CREATININE 0.68 04/16/2018 0351   CALCIUM 8.5 (L) 04/16/2018 0351   PROT 7.4 04/15/2018 1805   ALBUMIN 4.1 04/15/2018 1805   AST 22 04/15/2018 1805   ALT 22 04/15/2018 1805   ALKPHOS 59 04/15/2018 1805   BILITOT 0.8 04/15/2018 1805   GFRNONAA >60 04/16/2018 0351   GFRAA >60 04/16/2018 0351   Lipase     Component Value Date/Time   LIPASE 25 04/15/2018 1805       Studies/Results: Ct Abdomen Pelvis W Contrast  Result  Date: 04/15/2018 CLINICAL DATA:  Lower abdominal pain 1 day with diarrhea. EXAM: CT ABDOMEN AND PELVIS WITH CONTRAST TECHNIQUE: Multidetector CT imaging of the abdomen and pelvis was performed using the standard protocol following bolus administration of intravenous contrast. CONTRAST:  ISOVUE-300 IOPAMIDOL (ISOVUE-300) INJECTION 61% COMPARISON:  None. FINDINGS: Lower chest: Lung bases are within normal. Hepatobiliary: Gallbladder and biliary tree are normal. There are several small liver cysts present. 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 Pancreas is otherwise unremarkable. Spleen: Normal. Adrenals/Urinary Tract: Adrenal glands are normal. Kidneys are normal in size. 3 mm stone over the upper pole left kidney. No hydronephrosis. Few small left-sided subcentimeter renal cortical hypodensities too small to characterize but likely cysts. Ureters and bladder are normal. Stomach/Bowel: Stomach and small bowel are normal. Inflamed appendix measuring 1.1 cm in diameter with mild adjacent inflammatory change and free fluid. 3 mm appendicular with at the origin of the appendix. No evidence of periappendiceal abscess or perforation. Appendix: Location: Right lower quadrant. Diameter: 1.1 cm. Appendicolith: 3.2 mm. Mucosal hyper-enhancement: Minimal. Extraluminal gas: None. Periappendiceal collection: None. Mild colonic diverticulosis. Vascular/Lymphatic: Normal. Reproductive: Normal. Other: No evidence of abdominal wall hernia. Musculoskeletal: Minimal degenerate change of the spine. Subtle grade 1 anterolisthesis of L4 on L5. IMPRESSION: Evidence  of acute appendicitis. No evidence of perforation or periappendiceal abscess. Several small liver cysts. Nonobstructing 3 mm left renal stone. Several small subcentimeter left renal cortical hypodensities too small to characterize but likely cysts. Mild colonic diverticulosis. These results were called by telephone at the time of  interpretation on 04/15/2018 at 8:35 pm to Dr. SwazilandJORDAN ROBINSON , who verbally acknowledged these results. Electronically Signed   By: Elberta Fortisaniel  Boyle M.D.   On: 04/15/2018 20:35    Anti-infectives: Anti-infectives (From admission, onward)   Start     Dose/Rate Route Frequency Ordered Stop   04/16/18 2000  cefTRIAXone (ROCEPHIN) 2 g in sodium chloride 0.9 % 100 mL IVPB     2 g 200 mL/hr over 30 Minutes Intravenous Every 24 hours 04/16/18 0957     04/16/18 0957  metroNIDAZOLE (FLAGYL) IVPB 500 mg     500 mg 100 mL/hr over 60 Minutes Intravenous Every 8 hours 04/16/18 0957     04/15/18 2045  cefTRIAXone (ROCEPHIN) 2 g in sodium chloride 0.9 % 100 mL IVPB     2 g 200 mL/hr over 30 Minutes Intravenous  Once 04/15/18 2040 04/15/18 2127   04/15/18 2045  metroNIDAZOLE (FLAGYL) IVPB 500 mg     500 mg 100 mL/hr over 60 Minutes Intravenous  Once 04/15/18 2040 04/15/18 2231       Assessment/Plan Perforated appendicitis, POD 1, s/p lap appy, Dr. Maisie Fushomas 04-16-18 -adv diet as tolerates, but be aware as she may develop an ileus given gross contamination. -cont abx therapy for total of 7 days -mobilize and pulm toilet -cont JP drain for now.  -pain control with scheduled tylenol, prn oxy  FEN - IVFs/regular diet VTE - Lovenox/SCDs ID - Rocephin/Flagyl -->  LOS: 0 days    Letha CapeKelly E Sonal Dorwart , [facility] 04/17/2018, 8:08 AM Pager: [phone]

## 2018-04-18 LAB — CBC
HCT: 36.4 % (ref 36.0–46.0)
Hemoglobin: 11.3 g/dL — ABNORMAL LOW (ref 12.0–15.0)
MCH: 29.7 pg (ref 26.0–34.0)
MCHC: 31 g/dL (ref 30.0–36.0)
MCV: 95.8 fL (ref 80.0–100.0)
Platelets: 229 10*3/uL (ref 150–400)
RBC: 3.8 MIL/uL — ABNORMAL LOW (ref 3.87–5.11)
RDW: 13.2 % (ref 11.5–15.5)
WBC: 11.7 10*3/uL — ABNORMAL HIGH (ref 4.0–10.5)
nRBC: 0 % (ref 0.0–0.2)

## 2018-04-18 LAB — BASIC METABOLIC PANEL
Anion gap: 8 (ref 5–15)
BUN: 8 mg/dL (ref 6–20)
CO2: 25 mmol/L (ref 22–32)
Calcium: 8.4 mg/dL — ABNORMAL LOW (ref 8.9–10.3)
Chloride: 104 mmol/L (ref 98–111)
Creatinine, Ser: 0.56 mg/dL (ref 0.44–1.00)
GFR calc Af Amer: >60 mL/min (ref >60)
GFR calc non Af Amer: >60 mL/min (ref >60)
Glucose, Bld: 118 mg/dL — ABNORMAL HIGH (ref 70–99)
Potassium: 3.6 mmol/L (ref 3.5–5.1)
Sodium: 137 mmol/L (ref 135–145)

## 2018-04-18 MED ORDER — OXYCODONE HCL 5 MG PO TABS: 5.0000 mg | ORAL_TABLET | ORAL | 0 refills | Status: AC | PRN

## 2018-04-18 MED ORDER — ACETAMINOPHEN 500 MG PO TABS: 1000.0000 mg | ORAL_TABLET | Freq: Four times a day (QID) | ORAL | 0 refills | Status: AC | PRN

## 2018-04-18 MED ORDER — AMOXICILLIN-POT CLAVULANATE 875-125 MG PO TABS: 1.0000 | ORAL_TABLET | Freq: Two times a day (BID) | ORAL | 0 refills | Status: AC

## 2018-04-18 NOTE — Progress Notes (Signed)
Discharge instructions discussed with patient and family, including drain care, verbalized agreement and understanding.  Prescriptions given to patient

## 2018-04-18 NOTE — Discharge Summary (Signed)
     Patient ID: Clent RidgesLori Kawecki [id number] 12/30/1960 57 y.o.  Admit date: 04/15/2018 Discharge date: 04/18/2018  Admitting Diagnosis: Acute appendicitis  Discharge Diagnosis Patient Active Problem List   Diagnosis Date Noted  . Acute appendicitis, perforated 04/15/2018    Consultants none  Reason for Admission: pt began to have bloating and abd pain Fri afternoon.  This worsened on Sat and [facility]  CT shows appendicitis.  Procedures Lap appy, Dr. Maisie Fushomas 04-16-18  Hospital Course:  The patient was admitted and underwent a laparoscopic appendectomy for a perforated appendix with a drain placed.  The patient tolerated the procedure well.  On POD 1, the patient was tolerating clear liquids and had her diet advanced.  However, she had no passed flatus yet and there was a concern for an ileus given contamination.  However, by POD 2, she was feeling much better, passing flatus, and tolerating a regular diet with minimal pain.  The patient was stable for DC home at this time with appropriate follow up made.  She will be discharged on a total of 7 days of abx therapy.  She will see us in 1 week for drain evaluation and possible removal.     Physical Exam: Abd: soft, +BS, appropriately tender, JP drain with about 200cc of serous fluid in the last 24 hrs.  Incisions are all c/d/i with a small ecchymosis around her umbilical incision.  Allergies as of 04/18/2018      Reactions   Sulfa Antibiotics Hives      Medication List    TAKE these medications   acetaminophen 500 MG tablet Commonly known as:  TYLENOL Take 2 tablets (1,000 mg total) by mouth every 6 (six) hours as needed.   amoxicillin-clavulanate 875-125 MG tablet Commonly known as:  AUGMENTIN Take 1 tablet by mouth 2 (two) times daily for 6 days.   MULTIVITAMIN ADULT Chew Chew 1 tablet by mouth daily.   oxyCODONE 5 MG immediate release tablet Commonly known as:  Oxy IR/ROXICODONE Take 1 tablet (5 mg  total) by mouth every 4 (four) hours as needed for moderate pain.        Follow-up Information    [facility] on 04/25/2018.   Specialty:  General Surgery Why:  9:30am, arrive by 9:00am for paperwork and check in.  drain removal Contact information: [address] [place] [address] [phone]           Signed: Barnetta ChapelKelly Lucero Ide, [facility] 04/18/2018, 12:36 PM Pager: [phone]

## 2018-04-18 NOTE — Discharge Instructions (Signed)
[facility] ° °Please arrive at least 30 min before your appointment to complete your check in paperwork.  If you are unable to arrive 30 min prior to your appointment time we may have to cancel or reschedule you. °LAPAROSCOPIC SURGERY: POST OP INSTRUCTIONS °Always review your discharge instruction sheet given to you by the facility where your surgery was performed. °IF YOU HAVE DISABILITY OR FAMILY LEAVE FORMS, YOU MUST BRING THEM TO THE OFFICE FOR PROCESSING.   °DO NOT GIVE THEM TO YOUR DOCTOR. ° °PAIN CONTROL ° °1. First take acetaminophen (Tylenol) AND/or ibuprofen (Advil) to control your pain after surgery.  Follow directions on package.  Taking acetaminophen (Tylenol) and/or ibuprofen (Advil) regularly after surgery will help to control your pain and lower the amount of prescription pain medication you may need.  You should not take more than 4,000 mg (4 grams) of acetaminophen (Tylenol) in 24 hours.  You should not take ibuprofen (Advil), aleve, motrin, naprosyn or other NSAIDS if you have a history of stomach ulcers or chronic kidney disease.  °2. A prescription for pain medication may be given to you upon discharge.  Take your pain medication as prescribed, if you still have uncontrolled pain after taking acetaminophen (Tylenol) or ibuprofen (Advil). °3. Use ice packs to help control pain. °4. If you need a refill on your pain medication, please contact your pharmacy.  They will contact our office to request authorization. Prescriptions will not be filled after 5pm or on week-ends. ° °HOME MEDICATIONS °5. Take your usually prescribed medications unless otherwise directed. ° °DIET °6. You should follow a light diet the first few days after arrival home.  Be sure to include lots of fluids daily. Avoid fatty, fried foods.  ° °CONSTIPATION °7. It is common to experience some constipation after surgery and if you are taking pain medication.  Increasing fluid intake and taking a stool  softener (such as Colace) will usually help or prevent this problem from occurring.  A mild laxative (Milk of Magnesia or Miralax) should be taken according to package instructions if there are no bowel movements after 48 hours. ° °WOUND/INCISION CARE °8. Most patients will experience some swelling and bruising in the area of the incisions.  Ice packs will help.  Swelling and bruising can take several days to resolve.  °9. Unless discharge instructions indicate otherwise, follow guidelines below  °a. STERI-STRIPS - you may remove your outer bandages 48 hours after surgery, and you may shower at that time.  You have steri-strips (small skin tapes) in place directly over the incision.  These strips should be left on the skin for 7-10 days.   °b. DERMABOND/SKIN GLUE - you may shower in 24 hours.  The glue will flake off over the next 2-3 weeks. °10. Any sutures or staples will be removed at the office during your follow-up visit. ° °ACTIVITIES °11. You may resume regular (light) daily activities beginning the next day--such as daily self-care, walking, climbing stairs--gradually increasing activities as tolerated.  You may have sexual intercourse when it is comfortable.  Refrain from any heavy lifting or straining until approved by your doctor. °a. You may drive when you are no longer taking prescription pain medication, you can comfortably wear a seatbelt, and you can safely maneuver your car and apply brakes. ° °FOLLOW-UP °12. You should see your doctor in the office for a follow-up appointment approximately 2-3 weeks after your surgery.  You should have been given your post-op/follow-up appointment when   your surgery was scheduled.  If you did not receive a post-op/follow-up appointment, make sure that you call for this appointment within a day or two after you arrive home to insure a convenient appointment time. ° ° °WHEN TO CALL YOUR DOCTOR: °1. Fever over 101.0 °2. Inability to urinate °3. Continued bleeding from  incision. °4. Increased pain, redness, or drainage from the incision. °5. Increasing abdominal pain ° °The clinic staff is available to answer your questions during regular business hours.  Please don’t hesitate to call and ask to speak to one of the nurses for clinical concerns.  If you have a medical emergency, go to the nearest emergency room or call 911.  A surgeon from [facility] is always on call at the hospital. °[address], [address], [place], [place]  [address] ? [address] [address], , [place]   [address] °[phone] ? [phone] ? FAX [phone] ° °

## 2019-04-20 IMAGING — CT CT ABD-PELV W/ CM
2 of 5 series · 16 of 46 positions shown, 18 images · IV contrast (iopamidol)
Comparison: None.

CLINICAL DATA: Lower abdominal pain 1 day with diarrhea.

EXAM:
CT ABDOMEN AND PELVIS WITH CONTRAST
TECHNIQUE: Multidetector CT imaging of the abdomen and pelvis was performed
using the standard protocol following bolus administration of
intravenous contrast.
CONTRAST:  100mL [id number] IOPAMIDOL ([id number]) INJECTION 61%

[Series 2: axial st · axial · 0.82mm/px · z∈[-414,+6]mm · 13 of 94 slices shown, 15 images]
[im 5/94  soft-tissue]
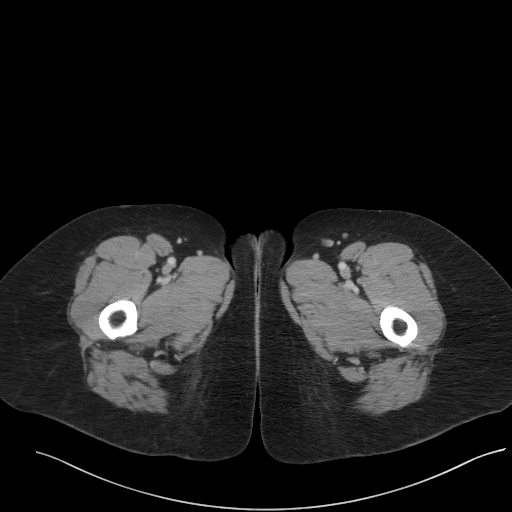
[im 5/94  bone]
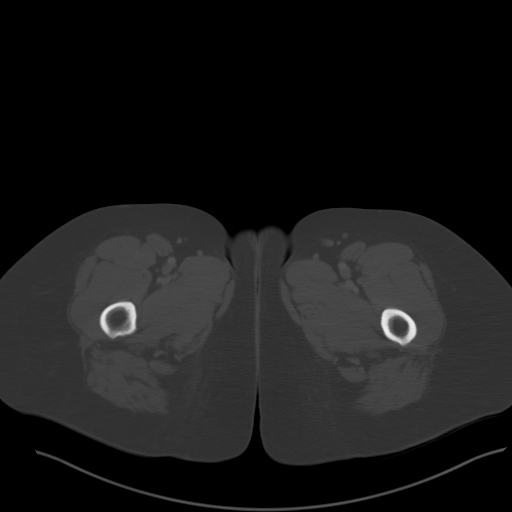
[im 15/94  soft-tissue]
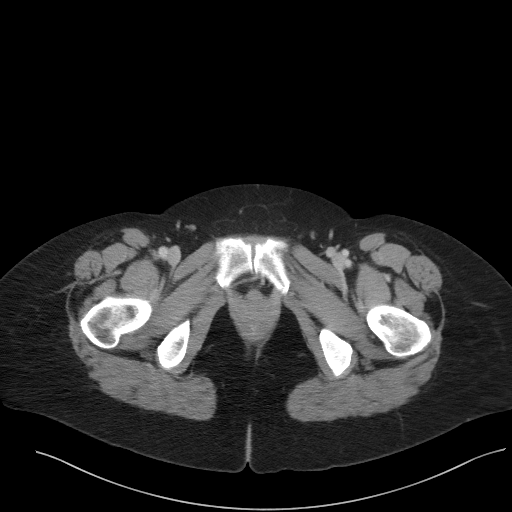
[im 20/94  soft-tissue]
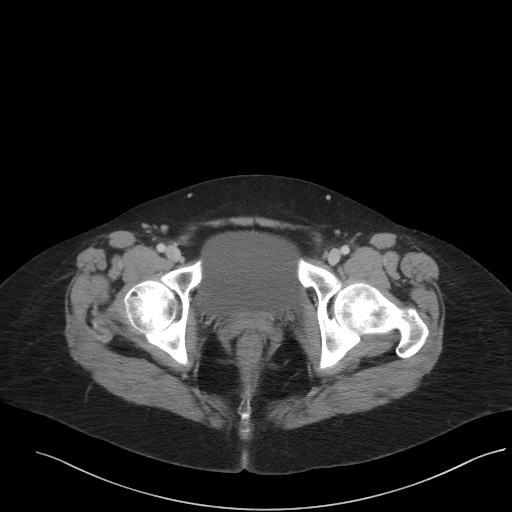
[im 25/94  soft-tissue]
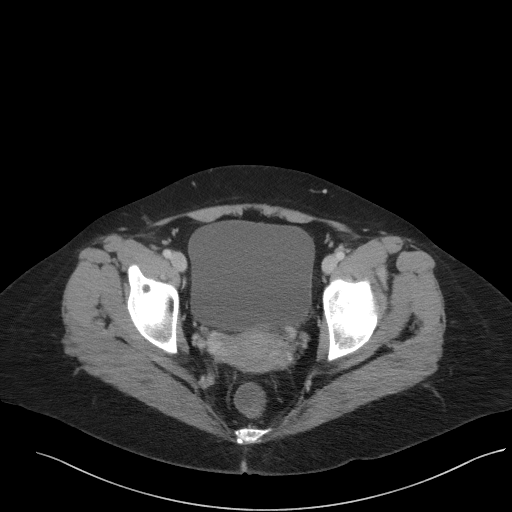
[im 35/94  soft-tissue]
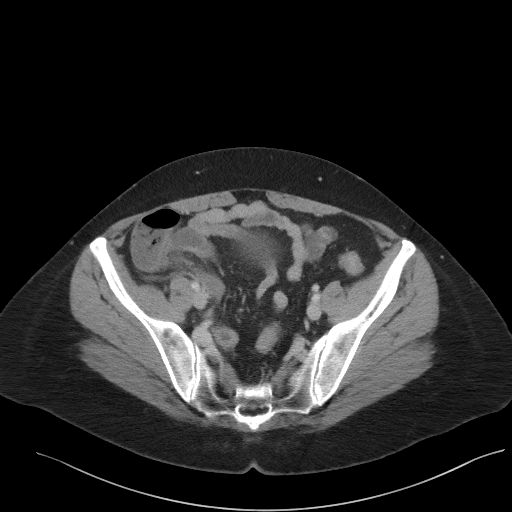
[im 40/94  soft-tissue]
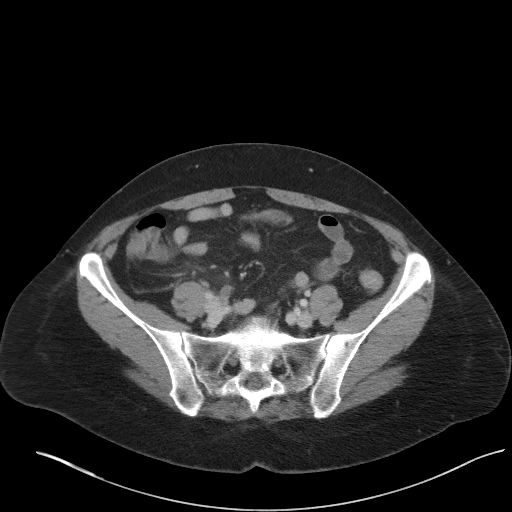
[im 49/94  soft-tissue]
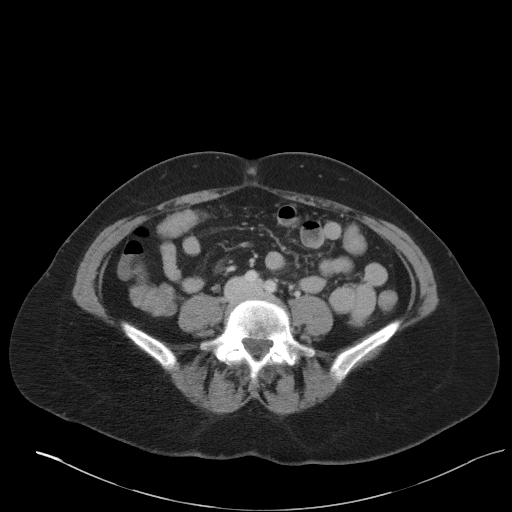
[im 54/94  soft-tissue]
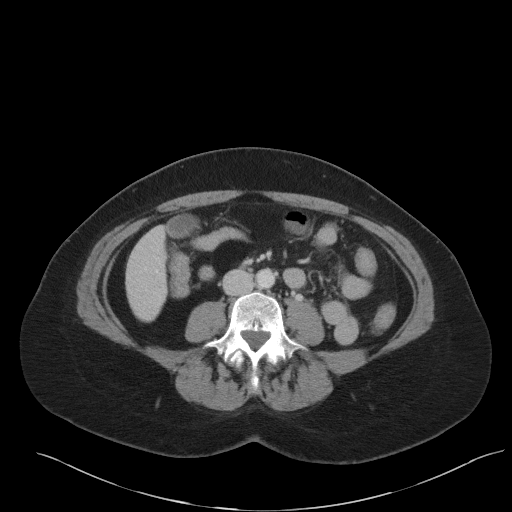
[im 59/94  soft-tissue]
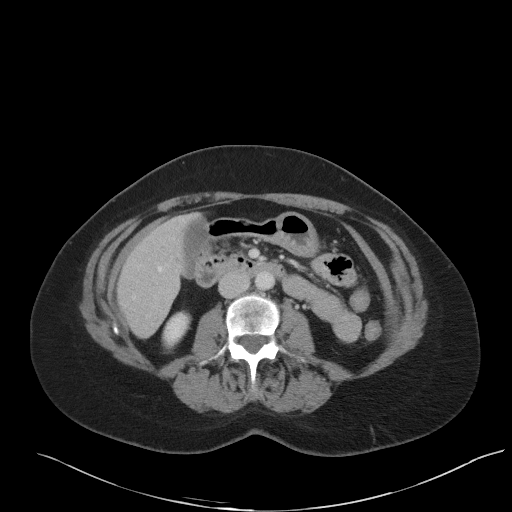
[im 59/94  bone]
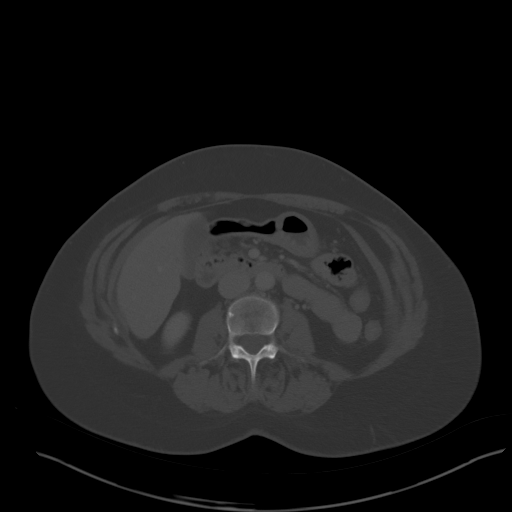
[im 69/94  soft-tissue]
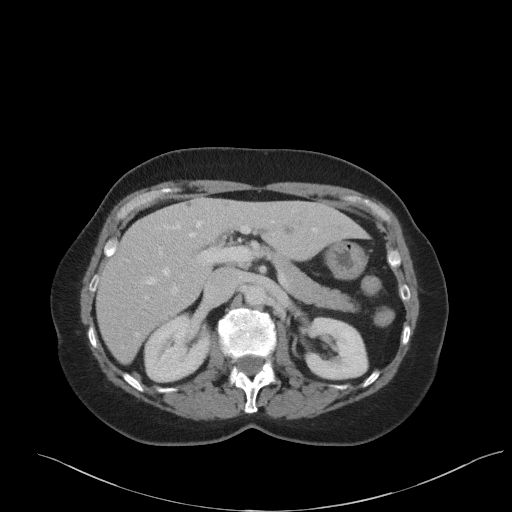
[im 74/94  soft-tissue]
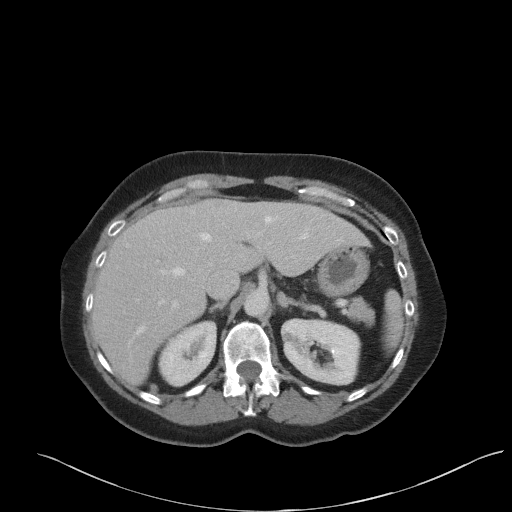
[im 79/94  soft-tissue]
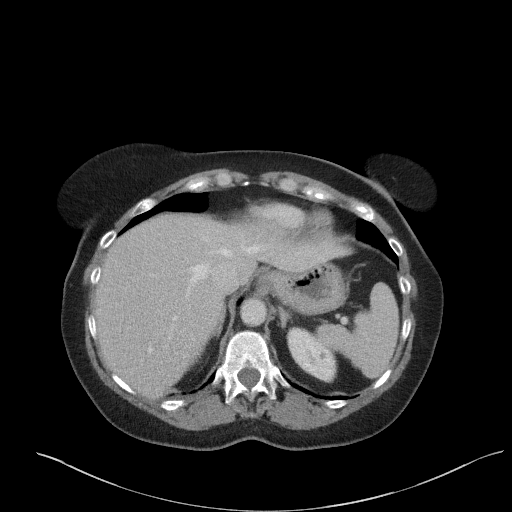
[im 89/94  soft-tissue]
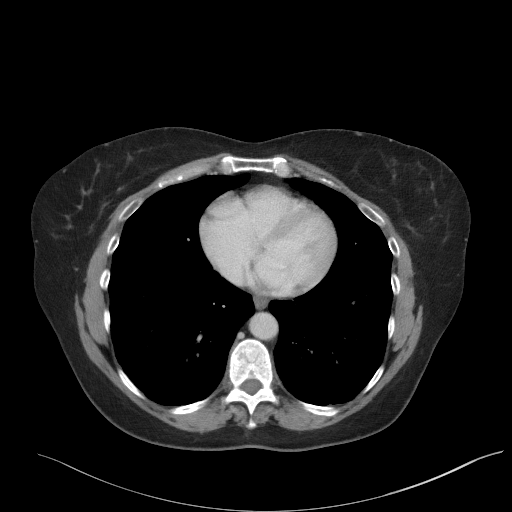

[Series 5: coronal st · coronal · 0.76mm/px · 3 of 90 slices shown]
[im 30/90  soft-tissue]
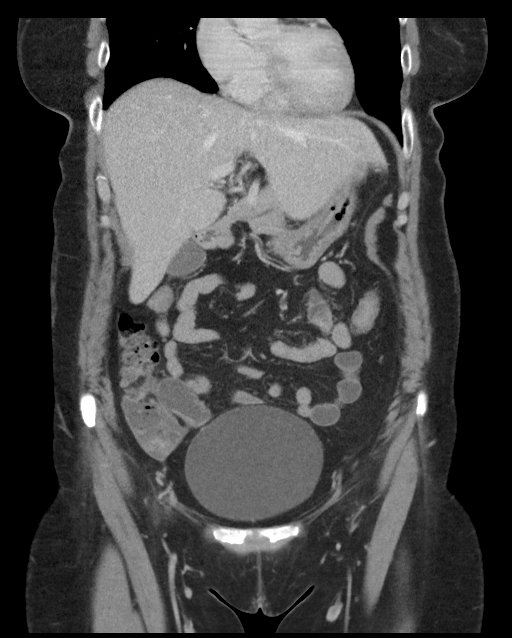
[im 40/90  soft-tissue]
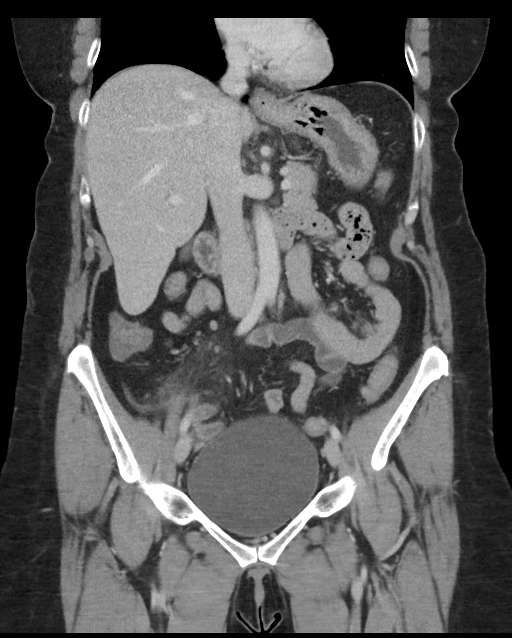
[im 50/90  soft-tissue]
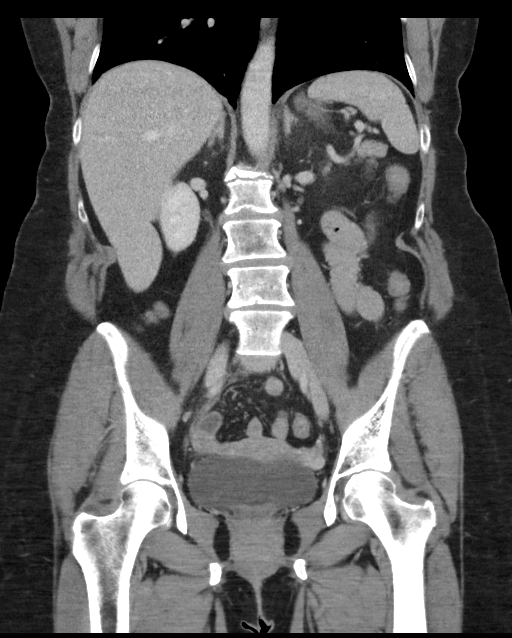

[16 of 46 positions shown; findings below may reference images not displayed]

FINDINGS: Lower chest: Lung bases are within normal.

Hepatobiliary: Gallbladder and biliary tree are normal. There are
several small liver cysts present.

Pancreas: 1.5 cm lipoma over the junction of the pancreatic
head/body. Pancreas is otherwise unremarkable.

Spleen: Normal.

Adrenals/Urinary Tract: Adrenal glands are normal. Kidneys are
normal in size. 3 mm stone over the upper pole left kidney. No
hydronephrosis. Few small left-sided subcentimeter renal cortical
hypodensities too small to characterize but likely cysts. Ureters
and bladder are normal.

Stomach/Bowel: Stomach and small bowel are normal. Inflamed appendix
measuring 1.1 cm in diameter with mild adjacent inflammatory change
and free fluid. 3 mm appendicular with at the origin of the
appendix. No evidence of periappendiceal abscess or perforation.

Appendix: Location: Right lower quadrant.

Diameter: 1.1 cm.

Appendicolith: 3.2 mm.

Mucosal hyper-enhancement: Minimal.

Extraluminal gas: None.

Periappendiceal collection: None.

Mild colonic diverticulosis.

Vascular/Lymphatic: Normal.

Reproductive: Normal.

Other: No evidence of abdominal wall hernia.

Musculoskeletal: Minimal degenerate change of the spine. Subtle
grade 1 anterolisthesis of L4 on L5.
IMPRESSION: Evidence of acute appendicitis. No evidence of perforation or
periappendiceal abscess.

Several small liver cysts.

Nonobstructing 3 mm left renal stone. Several small subcentimeter
left renal cortical hypodensities too small to characterize but
likely cysts.

Mild colonic diverticulosis.

These results were called by telephone at the time of interpretation
on 04/15/2018 at [DATE] to Dr. MERITZALIZ MAISONET , who verbally
acknowledged these results.

## 2020-01-29 ENCOUNTER — Other Ambulatory Visit: Payer: Self-pay | Admitting: Family Medicine

## 2020-01-29 ENCOUNTER — Other Ambulatory Visit (HOSPITAL_COMMUNITY)
Admission: RE | Admit: 2020-01-29 | Discharge: 2020-01-29 | Disposition: A | Discharge status: Home / Self Care | Payer: Self-pay | Source: Ambulatory Visit | Attending: Family Medicine | Admitting: Family Medicine

## 2020-01-29 DIAGNOSIS — Z01411 Encounter for gynecological examination (general) (routine) with abnormal findings: Secondary | ICD-10-CM | POA: Insufficient documentation

## 2020-01-31 LAB — CYTOLOGY - PAP
Comment: NEGATIVE
Diagnosis: NEGATIVE
High risk HPV: NEGATIVE

## 2021-03-30 ENCOUNTER — Encounter (HOSPITAL_BASED_OUTPATIENT_CLINIC_OR_DEPARTMENT_OTHER): Payer: Self-pay

## 2021-03-30 ENCOUNTER — Other Ambulatory Visit: Payer: Self-pay

## 2021-03-30 ENCOUNTER — Other Ambulatory Visit (HOSPITAL_BASED_OUTPATIENT_CLINIC_OR_DEPARTMENT_OTHER): Payer: Self-pay | Admitting: Family Medicine

## 2021-03-30 ENCOUNTER — Ambulatory Visit (HOSPITAL_BASED_OUTPATIENT_CLINIC_OR_DEPARTMENT_OTHER)
Admission: RE | Admit: 2021-03-30 | Discharge: 2021-03-30 | Disposition: A | Discharge status: Home / Self Care | Payer: 59 | Source: Ambulatory Visit | Attending: Family Medicine | Admitting: Family Medicine

## 2021-03-30 DIAGNOSIS — R1031 Right lower quadrant pain: Secondary | ICD-10-CM

## 2021-03-30 LAB — POCT I-STAT CREATININE: Creatinine, Ser: 0.8 mg/dL (ref 0.44–1.00)

## 2021-03-30 MED ORDER — IOHEXOL 300 MG/ML  SOLN
100.0000 mL | Freq: Once | INTRAMUSCULAR | Status: AC | PRN
  Administered 2021-03-30: 100 mL via INTRAVENOUS

## 2022-04-04 IMAGING — CT CT ABD-PELV W/ CM
2 of 5 series · 15 of 46 positions shown, 17 images · IV contrast (APPLIED)
Comparison: 04/15/2018

CLINICAL DATA: Right lower quadrant abdominal pain for 1 week,
worsening.

EXAM:
CT ABDOMEN AND PELVIS WITH CONTRAST
TECHNIQUE: Multidetector CT imaging of the abdomen and pelvis was performed
using the standard protocol following bolus administration of
intravenous contrast.
CONTRAST:  100mL OMNIPAQUE IOHEXOL 300 MG/ML  SOLN

[Series 2: abd pel w · axial · 0.90mm/px · z∈[+576,+986]mm · 12 of 92 slices shown, 14 images]
[im 5/92  soft-tissue]
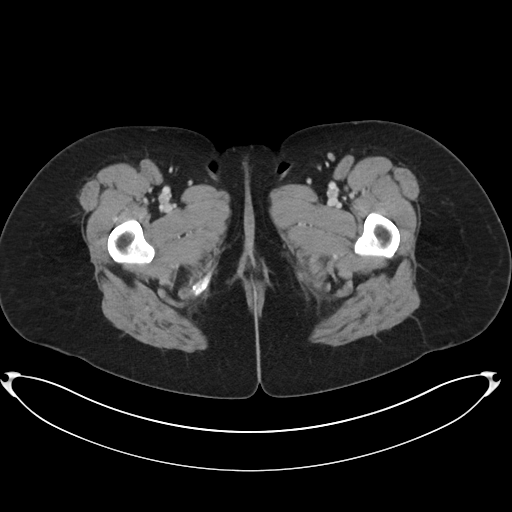
[im 5/92  bone]
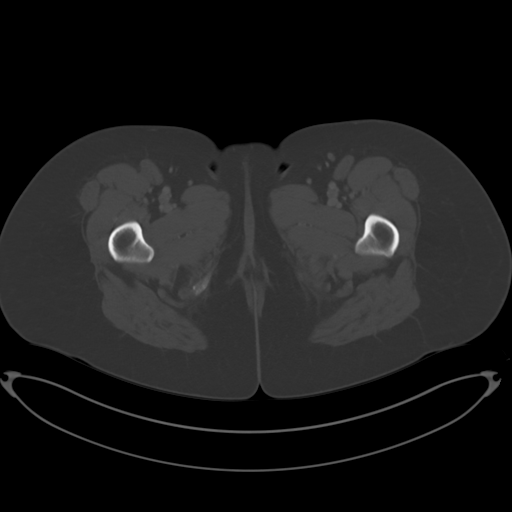
[im 14/92  soft-tissue]
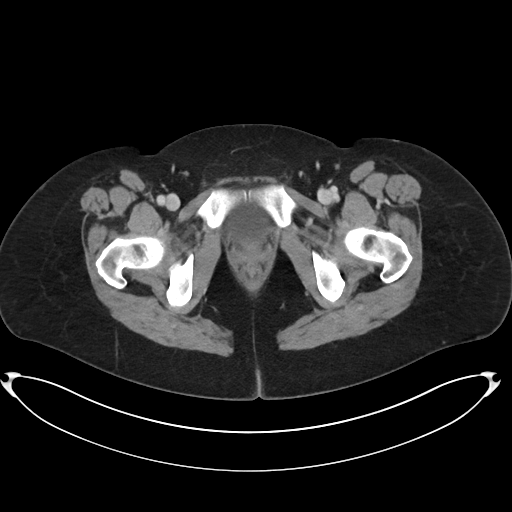
[im 22/92  soft-tissue]
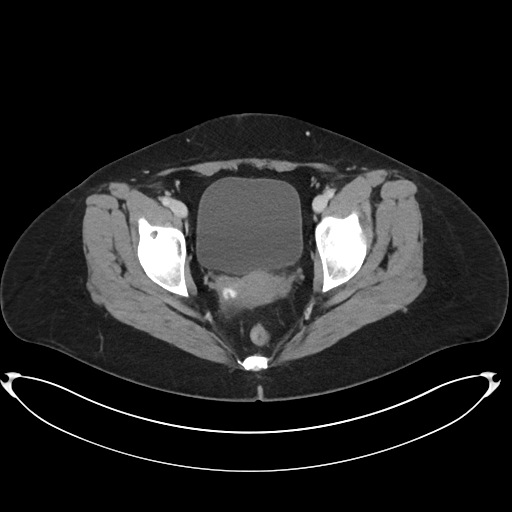
[im 27/92  soft-tissue]
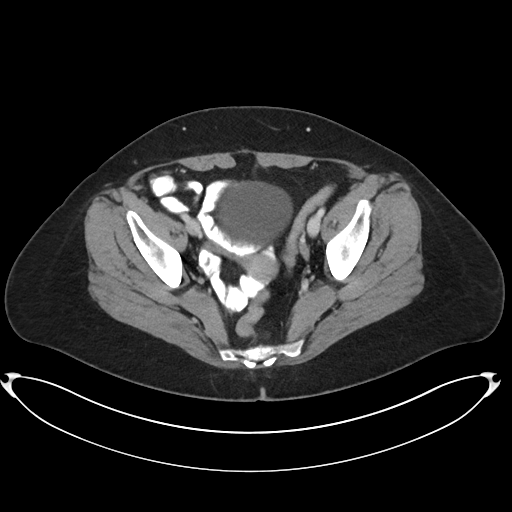
[im 35/92  soft-tissue]
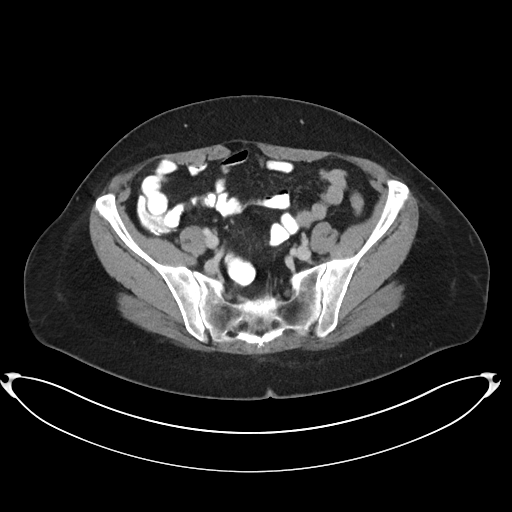
[im 44/92  soft-tissue]
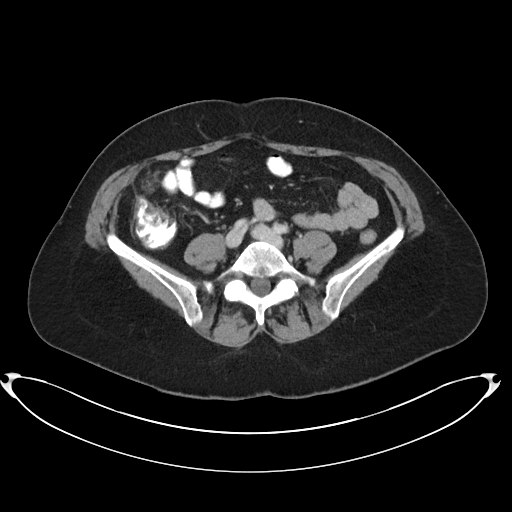
[im 48/92  soft-tissue]
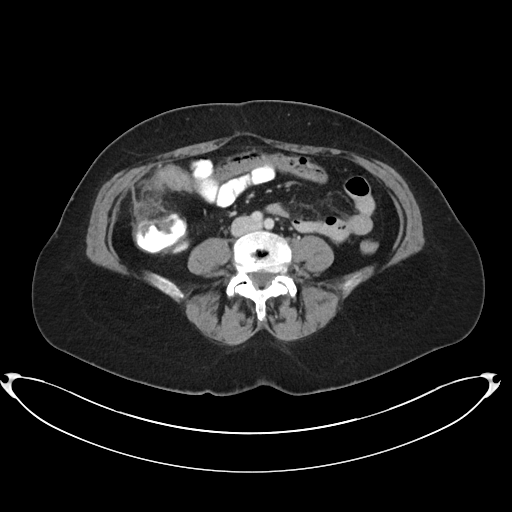
[im 57/92  soft-tissue]
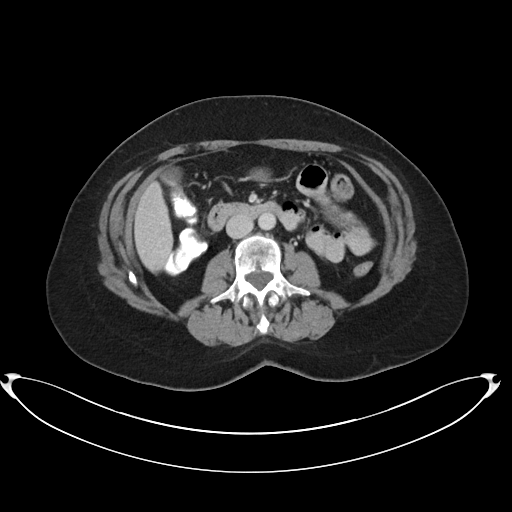
[im 66/92  soft-tissue]
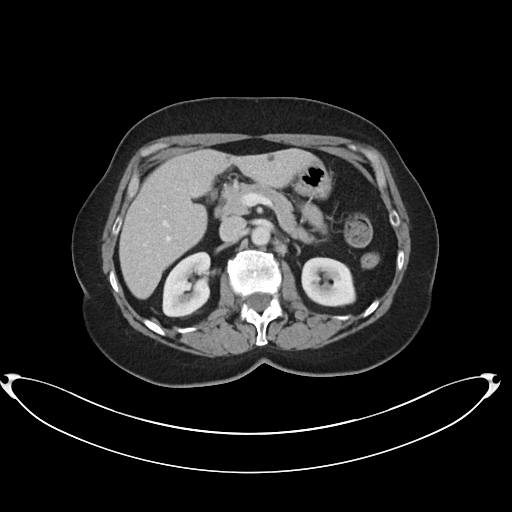
[im 66/92  bone]
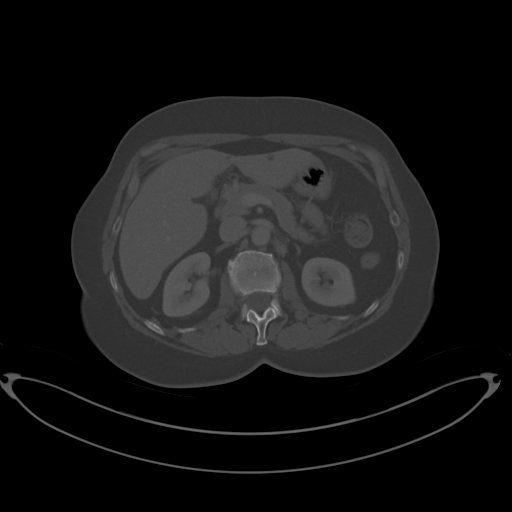
[im 70/92  soft-tissue]
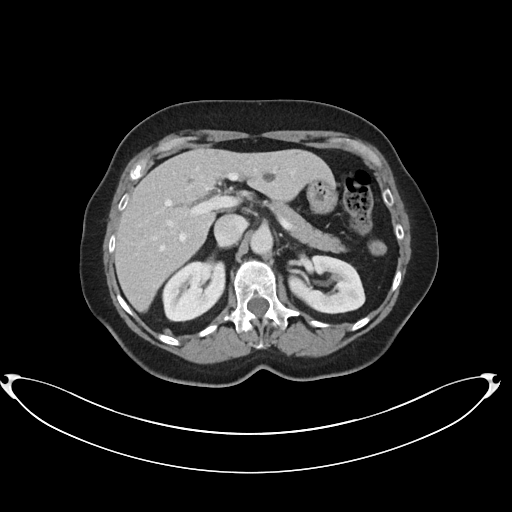
[im 79/92  soft-tissue]
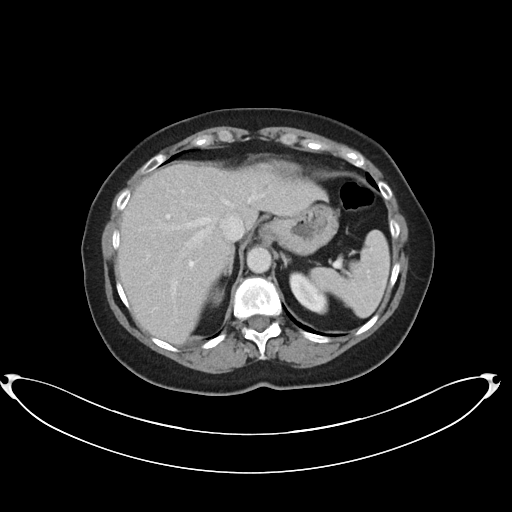
[im 87/92  soft-tissue]
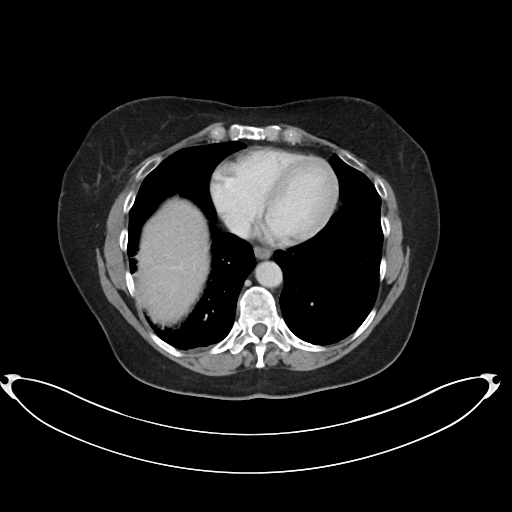

[Series 5: coronal · coronal · 0.85mm/px · 3 of 95 slices shown]
[im 32/95  soft-tissue]
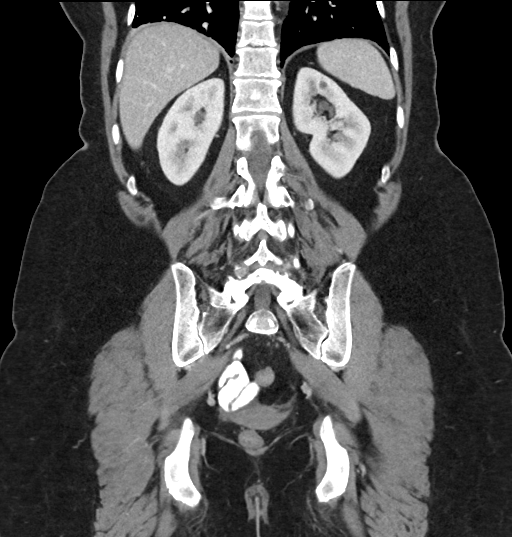
[im 42/95  soft-tissue]
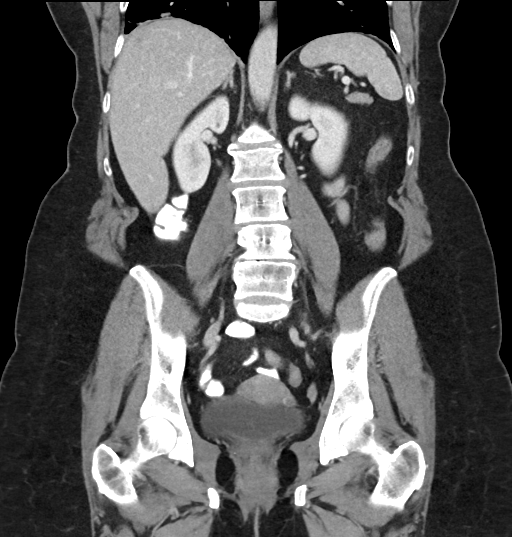
[im 53/95  soft-tissue]
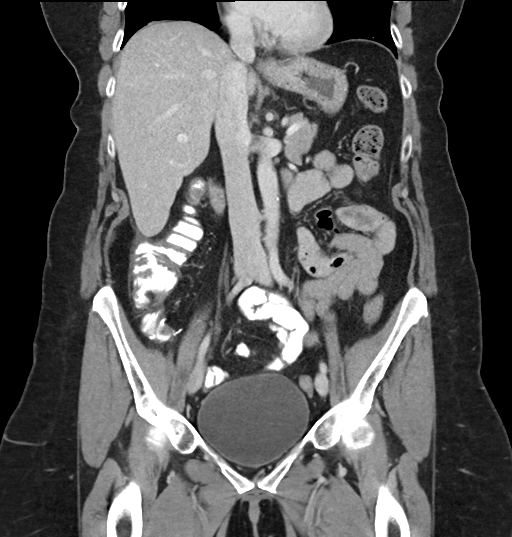

[15 of 46 positions shown; findings below may reference images not displayed]

FINDINGS: Lower chest: Subsegmental atelectasis versus scarring in the right
lower lobe. Small type 1 hiatal hernia.

Hepatobiliary: Small fluid density hepatic lesions compatible with
cysts. 4 mm enhancing lesion in the dome of the right hepatic lobe
on image 8 series 2 is probably a small flash filling hemangioma or
similar benign lesion, although not well seen on 04/15/2018. The
gallbladder appears unremarkable. No biliary dilatation.

Pancreas: Fatty mass favoring lipoma along the junction of the
pancreatic head and body measuring 1.6 by 1.4 by 0.8 cm.

Spleen: Unremarkable

Adrenals/Urinary Tract: Both adrenal glands appear normal. 0.7 cm
hypodense lesion of the left kidney upper pole and separate 0.7 cm
lesion of the left mid kidney, both likely cysts and similar to the
prior exam, although technically nonspecific due to small size.

4 mm nonobstructive left mid kidney stone on image 35 series 5.

Stomach/Bowel: Prior appendectomy. Wall thickening in a 4 cm segment
of the hepatic flexure immediately adjacent to a 3.4 by 2.5 by
cm fatty density structure with substantial surrounding inflammatory
stranding and a trace amount of adjacent fluid in the right
paracolic gutter. The appearance is suspicious for focal omental fat
necrosis with inflammatory stranding and secondary inflammation of
the colon, although the possibility of focal colitis with adjacent
secondary inflammation of the omental adipose tissue is a
possibility. The distal margin of the oral contrast column is
immediately in this vicinity suggesting the possibility of some
local ileus. No dilated bowel is observed.

Vascular/Lymphatic: Unremarkable

Reproductive: Unremarkable

Other: Trace free fluid in the cul-de-sac.

Musculoskeletal: Grade 1 degenerative anterolisthesis at L4-5.
Limbus vertebra at L2.
IMPRESSION: 1. 3.4 cm focus of rim density around omental adipose tissue in the
right abdomen with substantial surrounding inflammatory stranding.
There is a 4 cm segment of hepatic flexure which is thick-walled in
this vicinity, I tend to favor this as being secondary inflammation
of the colon in the setting of acute focal fat necrosis although the
possibility of prominent focal colitis with paracolic inflammatory
stranding is a differential diagnostic consideration. I do not see
any extraluminal gas or contrast in this region to indicate
perforation. The oral contrast column terminates in the vicinity of
this inflamed loop, which may be coincidental but also could
indicate a focal ileus. Trace free pelvic fluid. No dilated bowel.
2. If the patient is not current with colon cancer screening, then
following resolution of the patient's acute episode I would
recommend standard colon screening follow up.
3. In addition to scattered stable hepatic cysts, there is a 4 mm
focus of enhancement in the dome of the right hepatic lobe which is
probably a small vascular malformation or hemangioma, but
technically nonspecific.
4. Other imaging findings of potential clinical significance:
Scarring versus subsegmental atelectasis in the right lower lobe.
Small type 1 hiatal hernia. Stable lipoma of the pancreas.
Nonobstructive left nephrolithiasis.
